# Patient Record
Sex: Male | Born: 1980 | Race: White | Hispanic: Yes | Marital: Married | Smoking: Former smoker
Health system: Southern US, Community
[De-identification: ages and names within clinical notes are randomized; demographics above are authoritative.]

## PROBLEM LIST (undated history)

## (undated) DIAGNOSIS — R569 Unspecified convulsions: Secondary | ICD-10-CM

## (undated) DIAGNOSIS — K219 Gastro-esophageal reflux disease without esophagitis: Secondary | ICD-10-CM

## (undated) DIAGNOSIS — K760 Fatty (change of) liver, not elsewhere classified: Secondary | ICD-10-CM

## (undated) HISTORY — PX: NO PAST SURGERIES: SHX2092

---

## 2004-06-08 ENCOUNTER — Emergency Department: Payer: Self-pay | Admitting: Emergency Medicine

## 2004-07-11 ENCOUNTER — Ambulatory Visit: Payer: Self-pay | Admitting: Psychiatry

## 2004-07-19 ENCOUNTER — Ambulatory Visit: Payer: Self-pay | Admitting: Psychiatry

## 2005-01-11 ENCOUNTER — Emergency Department: Payer: Self-pay | Admitting: General Practice

## 2005-09-21 ENCOUNTER — Emergency Department: Payer: Self-pay | Admitting: Emergency Medicine

## 2005-09-24 ENCOUNTER — Emergency Department: Payer: Self-pay | Admitting: Emergency Medicine

## 2005-10-20 ENCOUNTER — Emergency Department: Payer: Self-pay | Admitting: Emergency Medicine

## 2016-06-21 ENCOUNTER — Emergency Department
Admission: EM | Admit: 2016-06-21 | Discharge: 2016-06-21 | Disposition: A | Discharge status: Home / Self Care | Payer: BLUE CROSS/BLUE SHIELD | Attending: Emergency Medicine | Admitting: Emergency Medicine

## 2016-06-21 ENCOUNTER — Emergency Department: Payer: BLUE CROSS/BLUE SHIELD

## 2016-06-21 DIAGNOSIS — R05 Cough: Secondary | ICD-10-CM | POA: Diagnosis present

## 2016-06-21 DIAGNOSIS — B9789 Other viral agents as the cause of diseases classified elsewhere: Secondary | ICD-10-CM

## 2016-06-21 DIAGNOSIS — J069 Acute upper respiratory infection, unspecified: Secondary | ICD-10-CM | POA: Diagnosis not present

## 2016-06-21 MED ORDER — GUAIFENESIN-CODEINE 100-10 MG/5ML PO SYRP: 10.0000 mL | ORAL_SOLUTION | Freq: Three times a day (TID) | ORAL | 0 refills | Status: DC | PRN

## 2016-06-21 NOTE — ED Triage Notes (Signed)
Pt with cough since Sunday, reports green sputum. Pt with even and nonlabored respirations.

## 2016-06-21 NOTE — ED Provider Notes (Signed)
[facility] Emergency Department Provider Note  ____________________________________________  Time seen: Approximately 11:23 AM  I have reviewed the triage vital signs and the nursing notes.   HISTORY  Chief Complaint Cough   HPI Juan Padilla is a 35 y.o. male who presents to the emergency department for evaluation of cough since Sunday. Cough is productive of green sputum.No relief with ibuprofen and OTC cold medications.   No past medical history on file.  There are no active problems to display for this patient.   No past surgical history on file.  Prior to Admission medications   Not on File    Allergies Review of patient's allergies indicates no known allergies.  No family history on file.  Social History Social History  Substance Use Topics  . Smoking status: Not on file  . Smokeless tobacco: Not on file  . Alcohol use Not on file    Review of Systems Constitutional: No fever/chills ENT: No sore throat. Cardiovascular: Denies chest pain. Respiratory: No shortness of breath. Positive for cough. Gastrointestinal: Negative for nausea,  no vomiting.  No diarrhea.  Musculoskeletal: Positive for body aches Skin: Negative for rash. Neurological: Positie for headaches ____________________________________________   PHYSICAL EXAM:   VITAL SIGNS: Blood pressure 133/93, heart rate 95, respiratory rate 18, SPO2 96% on room air, temperature 100.4 ED Triage Vitals [06/21/16 1110]  Enc Vitals Group     BP      Pulse      Resp      Temp      Temp src      SpO2      Weight (!) 306 lb (138.8 kg)     Height 5\' 8"  (1.727 m)     Head Circumference      Peak Flow      Pain Score      Pain Loc      Pain Edu?      Excl. in GC?     Constitutional: Alert and oriented. Well appearing and in no acute distress. Eyes: Conjunctivae are normal. EOMI. Ears: Bilateral TM normal Nose: No congestion; no rhinnorhea. Mouth/Throat: Mucous  membranes are moist.  Oropharynx clear. Tonsils appear 1+ without exudate. Neck: No stridor.  Lymphatic: No cervical lymphadenopathy. Cardiovascular: Normal rate, regular rhythm. Grossly normal heart sounds.  Good peripheral circulation. Respiratory: Normal respiratory effort.  No retractions. Breath sounds clear throughout. Gastrointestinal: Soft and nontender.  Musculoskeletal: FROM x 4 extremities.  Neurologic:  Normal speech and language.  Skin:  Skin is warm, dry and intact. No rash noted. Psychiatric: Mood and affect are normal. Speech and behavior are normal.  ____________________________________________   LABS (all labs ordered are listed, but only abnormal results are displayed)  Labs Reviewed - No data to display ____________________________________________  EKG   ____________________________________________  RADIOLOGY  Chest x-ray negative for acute cardiopulmonary abnormality per radiology. ____________________________________________   PROCEDURES  Procedure(s) performed: None  Critical Care performed: No  ____________________________________________   INITIAL IMPRESSION / ASSESSMENT AND PLAN / ED COURSE  Clinical Course    Pertinent labs & imaging results that were available during my care of the patient were reviewed by me and considered in my medical decision making (see chart for details).   Patient was prescribed Robitussin-AC and advised to take Tylenol or ibuprofen in addition for pain or fever. He was instructed to follow-up with the primary care provider of his choice for symptoms that are not improving over the next few days. He was  instructed to return to the emergency department for symptoms that change or worsen if unable to schedule appointment. ____________________________________________   FINAL CLINICAL IMPRESSION(S) / ED DIAGNOSES  Final diagnoses:  None    Note:  This document was prepared using Dragon voice recognition software  and may include unintentional dictation errors.     Chinita Pester, FNP 06/21/16 1432    Sharman Cheek, MD 06/21/16 1515

## 2016-06-21 NOTE — ED Notes (Signed)
States productive cough for several days, greenish sputum, pt awake and alert in no resp distress

## 2017-01-05 ENCOUNTER — Encounter: Payer: Self-pay | Admitting: Emergency Medicine

## 2017-01-05 ENCOUNTER — Emergency Department
Admission: EM | Admit: 2017-01-05 | Discharge: 2017-01-05 | Disposition: A | Discharge status: Home / Self Care | Payer: BLUE CROSS/BLUE SHIELD | Attending: Emergency Medicine | Admitting: Emergency Medicine

## 2017-01-05 ENCOUNTER — Emergency Department: Payer: BLUE CROSS/BLUE SHIELD

## 2017-01-05 DIAGNOSIS — S79912A Unspecified injury of left hip, initial encounter: Secondary | ICD-10-CM | POA: Diagnosis present

## 2017-01-05 DIAGNOSIS — Z87891 Personal history of nicotine dependence: Secondary | ICD-10-CM | POA: Diagnosis not present

## 2017-01-05 DIAGNOSIS — S301XXA Contusion of abdominal wall, initial encounter: Secondary | ICD-10-CM | POA: Insufficient documentation

## 2017-01-05 DIAGNOSIS — W1789XA Other fall from one level to another, initial encounter: Secondary | ICD-10-CM | POA: Diagnosis not present

## 2017-01-05 DIAGNOSIS — Y93I9 Activity, other involving external motion: Secondary | ICD-10-CM | POA: Diagnosis not present

## 2017-01-05 DIAGNOSIS — Y929 Unspecified place or not applicable: Secondary | ICD-10-CM | POA: Diagnosis not present

## 2017-01-05 DIAGNOSIS — S7002XA Contusion of left hip, initial encounter: Secondary | ICD-10-CM | POA: Diagnosis not present

## 2017-01-05 DIAGNOSIS — Y998 Other external cause status: Secondary | ICD-10-CM | POA: Diagnosis not present

## 2017-01-05 DIAGNOSIS — R109 Unspecified abdominal pain: Secondary | ICD-10-CM

## 2017-01-05 LAB — COMPREHENSIVE METABOLIC PANEL
ALT: 140 U/L — AB (ref 17–63)
ANION GAP: 5 (ref 5–15)
AST: 72 U/L — ABNORMAL HIGH (ref 15–41)
Albumin: 4.4 g/dL (ref 3.5–5.0)
Alkaline Phosphatase: 57 U/L (ref 38–126)
BUN: 9 mg/dL (ref 6–20)
CHLORIDE: 105 mmol/L (ref 101–111)
CO2: 26 mmol/L (ref 22–32)
Calcium: 9.2 mg/dL (ref 8.9–10.3)
Creatinine, Ser: 1.09 mg/dL (ref 0.61–1.24)
GFR calc non Af Amer: >60 mL/min (ref >60)
Glucose, Bld: 107 mg/dL — ABNORMAL HIGH (ref 65–99)
Potassium: 4.1 mmol/L (ref 3.5–5.1)
SODIUM: 136 mmol/L (ref 135–145)
Total Bilirubin: 1.3 mg/dL — ABNORMAL HIGH (ref 0.3–1.2)
Total Protein: 8 g/dL (ref 6.5–8.1)

## 2017-01-05 LAB — CBC WITH DIFFERENTIAL/PLATELET
BASOS ABS: 0.1 10*3/uL (ref 0–0.1)
BASOS PCT: 1 %
EOS ABS: 0.3 10*3/uL (ref 0–0.7)
Eosinophils Relative: 4 %
HCT: 41 % (ref 40.0–52.0)
Hemoglobin: 14.5 g/dL (ref 13.0–18.0)
Lymphocytes Relative: 36 %
Lymphs Abs: 2.6 10*3/uL (ref 1.0–3.6)
MCH: 31.4 pg (ref 26.0–34.0)
MCHC: 35.3 g/dL (ref 32.0–36.0)
MCV: 89 fL (ref 80.0–100.0)
Monocytes Absolute: 0.6 10*3/uL (ref 0.2–1.0)
Monocytes Relative: 8 %
NEUTROS PCT: 51 %
Neutro Abs: 3.7 10*3/uL (ref 1.4–6.5)
PLATELETS: 252 10*3/uL (ref 150–440)
RBC: 4.61 MIL/uL (ref 4.40–5.90)
RDW: 13.1 % (ref 11.5–14.5)
WBC: 7.3 10*3/uL (ref 3.8–10.6)

## 2017-01-05 LAB — URINALYSIS, COMPLETE (UACMP) WITH MICROSCOPIC
BACTERIA UA: NONE SEEN
BILIRUBIN URINE: NEGATIVE
Glucose, UA: NEGATIVE mg/dL
Hgb urine dipstick: NEGATIVE
KETONES UR: NEGATIVE mg/dL
LEUKOCYTES UA: NEGATIVE
NITRITE: NEGATIVE
Protein, ur: NEGATIVE mg/dL
RBC / HPF: NONE SEEN RBC/hpf (ref 0–5)
Specific Gravity, Urine: 1.006 (ref 1.005–1.030)
Squamous Epithelial / LPF: NONE SEEN
WBC UA: NONE SEEN WBC/hpf (ref 0–5)
pH: 8 (ref 5.0–8.0)

## 2017-01-05 MED ORDER — IOPAMIDOL (ISOVUE-300) INJECTION 61%
125.0000 mL | Freq: Once | INTRAVENOUS | Status: AC | PRN
  Administered 2017-01-05: 125 mL via INTRAVENOUS
  Filled 2017-01-05: qty 150

## 2017-01-05 MED ORDER — SODIUM CHLORIDE 0.9 % IV BOLUS (SEPSIS)
1000.0000 mL | Freq: Once | INTRAVENOUS | Status: AC
  Administered 2017-01-05: 1000 mL via INTRAVENOUS

## 2017-01-05 MED ORDER — NAPROXEN 500 MG PO TABS: 500.0000 mg | ORAL_TABLET | Freq: Two times a day (BID) | ORAL | 0 refills | Status: DC

## 2017-01-05 NOTE — ED Notes (Signed)
Patient transported to CT 

## 2017-01-05 NOTE — ED Provider Notes (Signed)
[facility] Emergency Department Provider Note  ____________________________________________  Time seen: Approximately 4:23 PM  I have reviewed the triage vital signs and the nursing notes.   HISTORY  Chief Complaint Fall    HPI Juan Padilla is a 36 y.o. male who reports a fall 1 week ago. He was standing up to get off of a riding lawnmower that he was trying to repair, when it inadvertently switched into its forward gear and started driving. He lost his balance and fell off onto his left side. He had some pain initially, but no bruising. Now over the last 4 or 5 days, he's had worsening bruising at the left hip and left flank. He also reports that when he stands up on a ladder his left hip hurts, and when he applies pressure to the area it is painful. Denies any dizziness chest pain shortness of breath vomiting or diarrhea. No bloody stool. No hematuria. Pain is intermittent, moderate in severity, sharp. No alleviating factors.     History reviewed. No pertinent past medical history.   There are no active problems to display for this patient.    History reviewed. No pertinent surgical history.   Prior to Admission medications   Medication Sig Start Date End Date Taking? Authorizing Provider  guaiFENesin-codeine (ROBITUSSIN AC) 100-10 MG/5ML syrup Take 10 mLs by mouth 3 (three) times daily as needed for cough. 06/21/16   Triplett, Rulon Eisenmenger B, FNP  naproxen (NAPROSYN) 500 MG tablet Take 1 tablet (500 mg total) by mouth 2 (two) times daily with a meal. 01/05/17   Sharman Cheek, MD     Allergies Patient has no known allergies.   History reviewed. No pertinent family history.  Social History Social History  Substance Use Topics  . Smoking status: Former Games developer  . Smokeless tobacco: Never Used  . Alcohol use Yes    Review of Systems  Constitutional:   No fever or chills.  ENT:   No sore throat. No rhinorrhea. Lymphatic: No swollen glands, No  extremity swelling Endocrine: No hot/cold flashes. No significant weight change. No neck swelling. Cardiovascular:   No chest pain or syncope. Respiratory:   No dyspnea or cough. Gastrointestinal:   Positive as above for left sided abdominal and flank pain, without vomiting and diarrhea.  Genitourinary:   Negative for dysuria or difficulty urinating. Musculoskeletal:   Left hip pain as above  Neurological:   Negative for headaches or weakness. All other systems reviewed and are negative except as documented above in ROS and HPI.  ____________________________________________   PHYSICAL EXAM:  VITAL SIGNS: ED Triage Vitals  Enc Vitals Group     BP 01/05/17 1204 (!) 149/88     Pulse Rate 01/05/17 1203 90     Resp 01/05/17 1203 20     Temp 01/05/17 1203 98.6 F (37 C)     Temp Source 01/05/17 1203 Oral     SpO2 01/05/17 1203 97 %     Weight 01/05/17 1203 (!) 309 lb (140.2 kg)     Height 01/05/17 1203 5\' 8"  (1.727 m)     Head Circumference --      Peak Flow --      Pain Score 01/05/17 1203 6     Pain Loc --      Pain Edu? --      Excl. in GC? --     Vital signs reviewed, nursing assessments reviewed.   Constitutional:   Alert and oriented. Well appearing and in  no distress. Eyes:   No scleral icterus. No conjunctival pallor. PERRL. EOMI.  No nystagmus. ENT   Head:   Normocephalic and atraumatic.   Nose:   No congestion/rhinnorhea. No septal hematoma   Mouth/Throat:   MMM, no pharyngeal erythema. No peritonsillar mass.    Neck:   No stridor. No SubQ emphysema. No meningismus. Hematological/Lymphatic/Immunilogical:   No cervical lymphadenopathy. Cardiovascular:   RRR. Symmetric bilateral radial and DP pulses.  No murmurs.  Respiratory:   Normal respiratory effort without tachypnea nor retractions. Breath sounds are clear and equal bilaterally. No wheezes/rales/rhonchi.Chest wall stable and nontender Gastrointestinal:   Soft with left sided tenderness and left  flank tenderness. There is extensive linear bruising in the area of the left iliac crest laterally.. Non distended. There is no CVA tenderness.  No rebound, rigidity, or guarding. Genitourinary:   deferred Musculoskeletal:   Normal range of motion in all extremities. No joint effusions.  No lower extremity tenderness.  No edema. Left iliac crest tender to the touch without mobility. Bruising along the left flank from the left lower back to the anterior left lower quadrant of the abdomen, central around the iliac crest. Neurologic:   Normal speech and language.  CN 2-10 normal. Motor grossly intact. No gross focal neurologic deficits are appreciated.  Skin:    Skin is warm, dry and intact. Ecchymosis as above  ____________________________________________    LABS (pertinent positives/negatives) (all labs ordered are listed, but only abnormal results are displayed) Labs Reviewed  COMPREHENSIVE METABOLIC PANEL - Abnormal; Notable for the following:       Result Value   Glucose, Bld 107 (*)    AST 72 (*)    ALT 140 (*)    Total Bilirubin 1.3 (*)    All other components within normal limits  URINALYSIS, COMPLETE (UACMP) WITH MICROSCOPIC - Abnormal; Notable for the following:    Color, Urine STRAW (*)    APPearance CLEAR (*)    All other components within normal limits  CBC WITH DIFFERENTIAL/PLATELET   ____________________________________________   EKG    ____________________________________________    RADIOLOGY  Ct Abdomen Pelvis W Contrast  Result Date: 01/05/2017 CLINICAL DATA:  The fell last week. Persistent left-sided pain and bruising. EXAM: CT ABDOMEN AND PELVIS WITH CONTRAST TECHNIQUE: Multidetector CT imaging of the abdomen and pelvis was performed using the standard protocol following bolus administration of intravenous contrast. CONTRAST:  ISOVUE-300 IOPAMIDOL (ISOVUE-300) INJECTION 61% COMPARISON:  None. FINDINGS: Lower chest: The lung bases are clear of acute  process. No pleural effusion or pulmonary lesions. The heart is normal in size. No pericardial effusion. The distal esophagus and aorta are unremarkable. Hepatobiliary: Severe and diffuse fatty infiltration of the liver. No focal hepatic lesions or intrahepatic biliary dilatation. Areas of fatty sparing are noted. The portal and hepatic veins are patent. The gallbladder appears normal. No common bile duct dilatation. Pancreas: No mass, inflammation or ductal dilatation. Spleen: Normal size.  No acute injury. Adrenals/Urinary Tract: Adrenal glands kidneys are normal. No renal or ureteral calculi. No bladder calculi are mass. Stomach/Bowel: The stomach, duodenum, small bowel and colon are unremarkable. No acute inflammatory changes, mass lesions or obstructive findings. Vascular/Lymphatic: The aorta is normal in caliber. No dissection. The branch vessels are patent. The major venous structures are patent. No mesenteric or retroperitoneal mass or adenopathy. Small scattered lymph nodes are noted. Reproductive: The prostate gland and seminal vesicles are normal. Other: No pelvic mass or adenopathy. No free pelvic fluid collections. No inguinal  mass or adenopathy. No abdominal wall hernia or subcutaneous lesions. There is edema, fluid or hemorrhage noted in the left lower flank area and upper left pelvic area most notably at the level of the iliac crests. This is likely contusion and hematoma. Musculoskeletal: The bony structures are intact. No pelvic or hip fracture. The pubic symphysis and SI joints are intact. No transverse process fractures are identified. No left-sided rib fractures. 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 The IMPRESSION: 1. No acute intra-abdominal/intrapelvic abnormalities. 2. Marked and diffuse fatty infiltration of the liver. 3. The bony structures are intact.  No acute fractures. 4. Contusion/hematoma in the subcutaneous fat overlying the left lower flank area and left  upper pelvic area centered at the iliac crest. No underlying pelvic fracture. 5. 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 Electronically Signed   By: Rudie MeyerP.  Gallerani M.D.   On: 01/05/2017 15:42    ____________________________________________   PROCEDURES Procedures  ____________________________________________   INITIAL IMPRESSION / ASSESSMENT AND PLAN / ED COURSE  Pertinent labs & imaging results that were available during my care of the patient were reviewed by me and considered in my medical decision making (see chart for details).    Clinical Course as of Jan 05 1622  Fri Jan 05, 2017  1500 Concern for splenic vs liver vs pelvic injury. Check CT a/p.  [PS]  1619 CT c/w hip pointer. Will tx sympotomaticalyl.  [PS]    Clinical Course User Index [PS] Sharman CheekStafford, Giani Betzold, MD     ----------------------------------------- 4:27 PM on 01/05/2017 -----------------------------------------  She remains well appearing, normal vital signs per workup negative except for evidence of the clinically apparent hip pointer injury. No solid organ injury. No other traumatic injuries suspected based on symptoms and exam which are otherwise reassuring. We'll discharge home to follow up with primary care. NSAIDs for pain.Considering the patient's symptoms, medical history, and physical examination today, I have low suspicion for cholecystitis or biliary pathology, pancreatitis, perforation or bowel obstruction, hernia, intra-abdominal abscess, AAA or dissection, volvulus or intussusception, mesenteric ischemia, or appendicitis.    ____________________________________________   FINAL CLINICAL IMPRESSION(S) / ED DIAGNOSES  Final diagnoses:  Hip pointer, initial encounter  Left flank pain      New Prescriptions   NAPROXEN (NAPROSYN) 500 MG TABLET    Take 1 tablet (500 mg total) by mouth 2 (two) times daily with a meal.     Portions of this note were generated with  dragon dictation software. Dictation errors may occur despite best attempts at proofreading.    Sharman CheekStafford, Jaiyana Canale, MD 01/05/17 [phone]

## 2017-01-05 NOTE — ED Notes (Signed)
MD at bedside. 

## 2017-01-05 NOTE — ED Triage Notes (Signed)
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  Reports felt ok through beginning of week and today noticed significant bruising and pain to abdomen. Pt has large bruise to LLQ and left side/flank.  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  Denies hematuria. Ambulatory to triage. Denies hitting head.

## 2017-01-05 NOTE — Discharge Instructions (Signed)
Your CT scan does not find any significant intra-abdominal or pelvis injuries. It does show a lot of bruising around the left hip in the soft tissues, consistent with a hip pointer injury.

## 2018-03-29 ENCOUNTER — Other Ambulatory Visit: Payer: Self-pay | Admitting: Orthopedic Surgery

## 2018-03-29 DIAGNOSIS — Z1389 Encounter for screening for other disorder: Secondary | ICD-10-CM

## 2018-03-29 DIAGNOSIS — Z0189 Encounter for other specified special examinations: Secondary | ICD-10-CM

## 2018-05-15 ENCOUNTER — Other Ambulatory Visit: Payer: Self-pay | Admitting: Orthopedic Surgery

## 2018-05-16 ENCOUNTER — Other Ambulatory Visit: Payer: Self-pay

## 2018-05-16 ENCOUNTER — Encounter
Admission: RE | Admit: 2018-05-16 | Discharge: 2018-05-16 | Disposition: A | Discharge status: Home / Self Care | Payer: BLUE CROSS/BLUE SHIELD | Source: Ambulatory Visit | Attending: Orthopedic Surgery | Admitting: Orthopedic Surgery

## 2018-05-16 HISTORY — DX: Gastro-esophageal reflux disease without esophagitis: K21.9

## 2018-05-16 HISTORY — DX: Fatty (change of) liver, not elsewhere classified: K76.0

## 2018-05-16 NOTE — Patient Instructions (Signed)
Your procedure is scheduled on: 05-23-18 Report to Same Day Surgery 2nd floor medical mall [facility](Medical Mall Entrance-take elevator on left to 2nd floor.  Check in with surgery information desk.) To find out your arrival time please call [phone] between 1PM - 3PM on 05-22-18  Remember: Instructions that are not followed completely may result in serious medical risk, up to and including death, or upon the discretion of your surgeon and anesthesiologist your surgery may need to be rescheduled.    _x___ 1. Do not eat food after midnight the night before your procedure. NO GUM OR CANDY AFTER MIDNIGHT.  You may drink clear liquids up to 2 hours before you are scheduled to arrive at the hospital for your procedure.  Do not drink clear liquids within 2 hours of your scheduled arrival to the hospital.  Clear liquids include  --Water or Apple juice without pulp  --Clear carbohydrate beverage such as ClearFast or Gatorade  --Black Coffee or Clear Tea (No milk, no creamers, do not add anything to the coffee or Tea   ____Ensure clear carbohydrate drink on the way to the hospital for bariatric patients  ____Ensure clear carbohydrate drink 3 hours before surgery for Dr Rutherford NailByrnett's patients if physician instructed.     __x__ 2. No Alcohol for 24 hours before or after surgery.   __x__3. No Smoking or e-cigarettes for 24 prior to surgery.  Do not use any chewable tobacco products for at least 6 hour prior to surgery   ____  4. Bring all medications with you on the day of surgery if instructed.    __x__ 5. Notify your doctor if there is any change in your medical condition     (cold, fever, infections).    x___6. On the morning of surgery brush your teeth with toothpaste and water.  You may rinse your mouth with mouth wash if you wish.  Do not swallow any toothpaste or mouthwash.   Do not wear jewelry, make-up, hairpins, clips or nail polish.  Do not wear lotions, powders, or perfumes. You may wear  deodorant.  Do not shave 48 hours prior to surgery. Men may shave face and neck.  Do not bring valuables to the hospital.    [facility] for any belongings or valuables.               Contacts, dentures or bridgework may not be worn into surgery.  Leave your suitcase in the car. After surgery it may be brought to your room.  For patients admitted to the hospital, discharge time is determined by your treatment team.  _  Patients discharged the day of surgery will not be allowed to drive home.  You will need someone to drive you home and stay with you the night of your procedure.    Please read over the following fact sheets that you were given:   [facility] Preparing for Surgery   _x___ TAKE THE FOLLOWING MEDICATION THE MORNING OF SURGERY WITH A SMALL SIP OF WATER. These include:  1. ZANTAC  2. TAKE A ZANTAC THE NIGHT BEFORE YOUR SURGERY  3.  4.  5.  6.  ____Fleets enema or Magnesium Citrate as directed.   _x___ Use CHG Soap or sage wipes as directed on instruction sheet   ____ Use inhalers on the day of surgery and bring to hospital day of surgery  ____ Stop Metformin and Janumet 2 days prior to surgery.    ____ Take 1/2 of  usual insulin dose the night before surgery and none on the morning surgery.   ____ Follow recommendations from Cardiologist, Pulmonologist or PCP regarding stopping Aspirin, Coumadin, Plavix ,Eliquis, Effient, or Pradaxa, and Pletal.  X____Stop Anti-inflammatories such as Advil, Aleve, Ibuprofen, Motrin, Naproxen, Naprosyn, Goodies powders or aspirin products NOW- OK to take Tylenol    ____ Stop supplements until after surgery.     ____ Bring C-Pap to the hospital.

## 2018-05-20 ENCOUNTER — Encounter
Admission: RE | Admit: 2018-05-20 | Discharge: 2018-05-20 | Disposition: A | Discharge status: Home / Self Care | Payer: BLUE CROSS/BLUE SHIELD | Source: Ambulatory Visit | Attending: Orthopedic Surgery | Admitting: Orthopedic Surgery

## 2018-05-20 DIAGNOSIS — Z01812 Encounter for preprocedural laboratory examination: Secondary | ICD-10-CM | POA: Insufficient documentation

## 2018-05-20 LAB — COMPREHENSIVE METABOLIC PANEL
ALK PHOS: 66 U/L (ref 38–126)
ALT: 120 U/L — AB (ref 0–44)
AST: 64 U/L — ABNORMAL HIGH (ref 15–41)
Albumin: 4.4 g/dL (ref 3.5–5.0)
Anion gap: 7 (ref 5–15)
BILIRUBIN TOTAL: 0.8 mg/dL (ref 0.3–1.2)
BUN: 11 mg/dL (ref 6–20)
CALCIUM: 9.2 mg/dL (ref 8.9–10.3)
CO2: 26 mmol/L (ref 22–32)
CREATININE: 1.01 mg/dL (ref 0.61–1.24)
Chloride: 107 mmol/L (ref 98–111)
GFR calc Af Amer: >60 mL/min (ref >60)
GFR calc non Af Amer: >60 mL/min (ref >60)
GLUCOSE: 193 mg/dL — AB (ref 70–99)
Potassium: 3.5 mmol/L (ref 3.5–5.1)
Sodium: 140 mmol/L (ref 135–145)
TOTAL PROTEIN: 7.7 g/dL (ref 6.5–8.1)

## 2018-05-20 LAB — CBC WITH DIFFERENTIAL/PLATELET
Basophils Absolute: 0.1 10*3/uL (ref 0–0.1)
Basophils Relative: 2 %
EOS ABS: 0.2 10*3/uL (ref 0–0.7)
EOS PCT: 4 %
HCT: 43.1 % (ref 40.0–52.0)
HEMOGLOBIN: 15.1 g/dL (ref 13.0–18.0)
LYMPHS ABS: 2.2 10*3/uL (ref 1.0–3.6)
Lymphocytes Relative: 46 %
MCH: 31.9 pg (ref 26.0–34.0)
MCHC: 35 g/dL (ref 32.0–36.0)
MCV: 91.1 fL (ref 80.0–100.0)
MONO ABS: 0.3 10*3/uL (ref 0.2–1.0)
Monocytes Relative: 7 %
Neutro Abs: 2 10*3/uL (ref 1.4–6.5)
Neutrophils Relative %: 41 %
PLATELETS: 217 10*3/uL (ref 150–440)
RBC: 4.73 MIL/uL (ref 4.40–5.90)
RDW: 13 % (ref 11.5–14.5)
WBC: 4.9 10*3/uL (ref 3.8–10.6)

## 2018-05-20 LAB — PROTIME-INR
INR: 0.92
PROTHROMBIN TIME: 12.3 s (ref 11.4–15.2)

## 2018-05-20 LAB — APTT: aPTT: 29 seconds (ref 24–36)

## 2018-05-22 MED ORDER — CEFAZOLIN SODIUM-DEXTROSE 2-4 GM/100ML-% IV SOLN
2.0000 g | INTRAVENOUS | Status: AC
  Administered 2018-05-23: 2 g via INTRAVENOUS

## 2018-05-23 ENCOUNTER — Ambulatory Visit
Admission: RE | Admit: 2018-05-23 | Discharge: 2018-05-23 | Disposition: A | Discharge status: Home / Self Care | Payer: Worker's Compensation | Source: Ambulatory Visit | Attending: Orthopedic Surgery | Admitting: Orthopedic Surgery

## 2018-05-23 ENCOUNTER — Encounter
Admission: RE | Disposition: A | Discharge status: Home / Self Care | Payer: Self-pay | Source: Ambulatory Visit | Attending: Orthopedic Surgery

## 2018-05-23 ENCOUNTER — Ambulatory Visit: Payer: Worker's Compensation | Admitting: Certified Registered"

## 2018-05-23 DIAGNOSIS — S43431A Superior glenoid labrum lesion of right shoulder, initial encounter: Secondary | ICD-10-CM | POA: Insufficient documentation

## 2018-05-23 DIAGNOSIS — K760 Fatty (change of) liver, not elsewhere classified: Secondary | ICD-10-CM | POA: Diagnosis not present

## 2018-05-23 DIAGNOSIS — Z79899 Other long term (current) drug therapy: Secondary | ICD-10-CM | POA: Diagnosis not present

## 2018-05-23 DIAGNOSIS — K219 Gastro-esophageal reflux disease without esophagitis: Secondary | ICD-10-CM | POA: Diagnosis not present

## 2018-05-23 DIAGNOSIS — X58XXXA Exposure to other specified factors, initial encounter: Secondary | ICD-10-CM | POA: Insufficient documentation

## 2018-05-23 DIAGNOSIS — Z87891 Personal history of nicotine dependence: Secondary | ICD-10-CM | POA: Diagnosis not present

## 2018-05-23 DIAGNOSIS — M7541 Impingement syndrome of right shoulder: Secondary | ICD-10-CM | POA: Insufficient documentation

## 2018-05-23 DIAGNOSIS — M7551 Bursitis of right shoulder: Secondary | ICD-10-CM | POA: Insufficient documentation

## 2018-05-23 DIAGNOSIS — Y939 Activity, unspecified: Secondary | ICD-10-CM | POA: Diagnosis not present

## 2018-05-23 DIAGNOSIS — R569 Unspecified convulsions: Secondary | ICD-10-CM | POA: Diagnosis not present

## 2018-05-23 HISTORY — PX: SHOULDER ARTHROSCOPY WITH LABRAL REPAIR: SHX5691

## 2018-05-23 HISTORY — DX: Unspecified convulsions: R56.9

## 2018-05-23 LAB — GLUCOSE, CAPILLARY
GLUCOSE-CAPILLARY: 129 mg/dL — AB (ref 70–99)
Glucose-Capillary: 120 mg/dL — ABNORMAL HIGH (ref 70–99)

## 2018-05-23 SURGERY — ARTHROSCOPY, SHOULDER, WITH GLENOID LABRUM REPAIR
Anesthesia: General | Site: Shoulder | Laterality: Right

## 2018-05-23 MED ORDER — SUGAMMADEX SODIUM 500 MG/5ML IV SOLN
INTRAVENOUS | Status: AC
  Filled 2018-05-23: qty 5

## 2018-05-23 MED ORDER — CEFAZOLIN SODIUM-DEXTROSE 1-4 GM/50ML-% IV SOLN
INTRAVENOUS | Status: DC | PRN
  Administered 2018-05-23: 1 g via INTRAVENOUS

## 2018-05-23 MED ORDER — ONDANSETRON HCL 4 MG/2ML IJ SOLN
INTRAMUSCULAR | Status: DC | PRN
  Administered 2018-05-23: 4 mg via INTRAVENOUS

## 2018-05-23 MED ORDER — LIDOCAINE HCL 4 % MT SOLN
OROMUCOSAL | Status: DC | PRN
  Administered 2018-05-23: 4 mL via TOPICAL

## 2018-05-23 MED ORDER — LIDOCAINE HCL (CARDIAC) PF 100 MG/5ML IV SOSY
PREFILLED_SYRINGE | INTRAVENOUS | Status: DC | PRN
  Administered 2018-05-23: 100 mg via INTRAVENOUS

## 2018-05-23 MED ORDER — CHLORHEXIDINE GLUCONATE CLOTH 2 % EX PADS: 6.0000 | MEDICATED_PAD | Freq: Once | CUTANEOUS | Status: DC

## 2018-05-23 MED ORDER — FENTANYL CITRATE (PF) 100 MCG/2ML IJ SOLN
INTRAMUSCULAR | Status: AC
  Filled 2018-05-23: qty 2

## 2018-05-23 MED ORDER — SUGAMMADEX SODIUM 500 MG/5ML IV SOLN
INTRAVENOUS | Status: DC | PRN
  Administered 2018-05-23: 280 mg via INTRAVENOUS

## 2018-05-23 MED ORDER — FENTANYL CITRATE (PF) 100 MCG/2ML IJ SOLN
INTRAMUSCULAR | Status: DC | PRN
  Administered 2018-05-23: 100 ug via INTRAVENOUS

## 2018-05-23 MED ORDER — ROCURONIUM BROMIDE 100 MG/10ML IV SOLN
INTRAVENOUS | Status: DC | PRN
  Administered 2018-05-23: 50 mg via INTRAVENOUS

## 2018-05-23 MED ORDER — SODIUM CHLORIDE 0.9 % IV SOLN
INTRAVENOUS | Status: DC | PRN
  Administered 2018-05-23: 20 ug/min via INTRAVENOUS

## 2018-05-23 MED ORDER — FENTANYL CITRATE (PF) 100 MCG/2ML IJ SOLN
50.0000 ug | Freq: Once | INTRAMUSCULAR | Status: AC
  Administered 2018-05-23: 50 ug via INTRAVENOUS

## 2018-05-23 MED ORDER — FENTANYL CITRATE (PF) 100 MCG/2ML IJ SOLN: 25.0000 ug | INTRAMUSCULAR | Status: DC | PRN

## 2018-05-23 MED ORDER — ONDANSETRON HCL 4 MG PO TABS: 4.0000 mg | ORAL_TABLET | Freq: Three times a day (TID) | ORAL | 0 refills | Status: AC | PRN

## 2018-05-23 MED ORDER — OXYCODONE HCL 5 MG PO TABS: 5.0000 mg | ORAL_TABLET | ORAL | 0 refills | Status: AC | PRN

## 2018-05-23 MED ORDER — LIDOCAINE HCL (PF) 1 % IJ SOLN
INTRAMUSCULAR | Status: AC
  Filled 2018-05-23: qty 5

## 2018-05-23 MED ORDER — SEVOFLURANE IN SOLN
RESPIRATORY_TRACT | Status: AC
  Filled 2018-05-23: qty 250

## 2018-05-23 MED ORDER — ONDANSETRON HCL 4 MG/2ML IJ SOLN: 4.0000 mg | Freq: Once | INTRAMUSCULAR | Status: DC | PRN

## 2018-05-23 MED ORDER — ROCURONIUM BROMIDE 50 MG/5ML IV SOLN
INTRAVENOUS | Status: AC
  Filled 2018-05-23: qty 1

## 2018-05-23 MED ORDER — LACTATED RINGERS IV SOLN
INTRAVENOUS | Status: DC | PRN
  Administered 2018-05-23: 17 mL

## 2018-05-23 MED ORDER — BUPIVACAINE LIPOSOME 1.3 % IJ SUSP
INTRAMUSCULAR | Status: DC | PRN
  Administered 2018-05-23: 20 mL

## 2018-05-23 MED ORDER — BUPIVACAINE HCL (PF) 0.25 % IJ SOLN
INTRAMUSCULAR | Status: DC | PRN
  Administered 2018-05-23: 30 mL

## 2018-05-23 MED ORDER — BUPIVACAINE HCL (PF) 0.5 % IJ SOLN
INTRAMUSCULAR | Status: AC
  Filled 2018-05-23: qty 10

## 2018-05-23 MED ORDER — BUPIVACAINE LIPOSOME 1.3 % IJ SUSP
INTRAMUSCULAR | Status: AC
  Filled 2018-05-23: qty 20

## 2018-05-23 MED ORDER — FENTANYL CITRATE (PF) 100 MCG/2ML IJ SOLN
INTRAMUSCULAR | Status: AC
  Administered 2018-05-23: 50 ug via INTRAVENOUS
  Filled 2018-05-23: qty 2

## 2018-05-23 MED ORDER — ONDANSETRON HCL 4 MG/2ML IJ SOLN
INTRAMUSCULAR | Status: AC
  Filled 2018-05-23: qty 2

## 2018-05-23 MED ORDER — MIDAZOLAM HCL 2 MG/2ML IJ SOLN
2.0000 mg | Freq: Once | INTRAMUSCULAR | Status: AC
  Administered 2018-05-23: 2 mg via INTRAVENOUS

## 2018-05-23 MED ORDER — CEFAZOLIN SODIUM-DEXTROSE 2-4 GM/100ML-% IV SOLN
INTRAVENOUS | Status: AC
  Filled 2018-05-23: qty 100

## 2018-05-23 MED ORDER — LIDOCAINE HCL (PF) 2 % IJ SOLN
INTRAMUSCULAR | Status: AC
  Filled 2018-05-23: qty 10

## 2018-05-23 MED ORDER — LACTATED RINGERS IV SOLN
INTRAVENOUS | Status: DC
  Administered 2018-05-23 (×2): via INTRAVENOUS

## 2018-05-23 MED ORDER — PROPOFOL 10 MG/ML IV BOLUS
INTRAVENOUS | Status: AC
  Filled 2018-05-23: qty 20

## 2018-05-23 MED ORDER — BUPIVACAINE HCL (PF) 0.5 % IJ SOLN
INTRAMUSCULAR | Status: DC | PRN
  Administered 2018-05-23: 10 mL

## 2018-05-23 MED ORDER — PROPOFOL 10 MG/ML IV BOLUS
INTRAVENOUS | Status: DC | PRN
  Administered 2018-05-23: 250 mg via INTRAVENOUS

## 2018-05-23 MED ORDER — MIDAZOLAM HCL 2 MG/2ML IJ SOLN
INTRAMUSCULAR | Status: AC
  Administered 2018-05-23: 2 mg via INTRAVENOUS
  Filled 2018-05-23: qty 2

## 2018-05-23 SURGICAL SUPPLY — 62 items
ADAPTER IRRIG TUBE 2 SPIKE SOL (ADAPTER) ×4 IMPLANT
ANCHOR SUT BIOC ST 3X145 (Anchor) ×6 IMPLANT
BUR RADIUS 4.0X18.5 (BURR) ×2 IMPLANT
BUR RADIUS 5.5 (BURR) ×2 IMPLANT
CANISTER SUCT LVC 12 LTR MEDI- (MISCELLANEOUS) IMPLANT
CANNULA 5.75X7 CRYSTAL CLEAR (CANNULA) ×4 IMPLANT
CANNULA PARTIAL THREAD 2X7 (CANNULA) ×4 IMPLANT
CANNULA TWIST IN 8.25X9CM (CANNULA) IMPLANT
COOLER POLAR GLACIER W/PUMP (MISCELLANEOUS) ×2 IMPLANT
CRADLE LAMINECT ARM (MISCELLANEOUS) ×2 IMPLANT
DEVICE SUCT BLK HOLE OR FLOOR (MISCELLANEOUS) IMPLANT
DRAPE IMP U-DRAPE 54X76 (DRAPES) ×6 IMPLANT
DRAPE INCISE IOBAN 66X45 STRL (DRAPES) ×2 IMPLANT
DRAPE SHEET LG 3/4 BI-LAMINATE (DRAPES) ×2 IMPLANT
DRAPE U-SHAPE 47X51 STRL (DRAPES) IMPLANT
DURAPREP 26ML APPLICATOR (WOUND CARE) ×8 IMPLANT
ELECT REM PT RETURN 9FT ADLT (ELECTROSURGICAL)
ELECTRODE REM PT RTRN 9FT ADLT (ELECTROSURGICAL) IMPLANT
GAUZE PETRO XEROFOAM 1X8 (MISCELLANEOUS) ×2 IMPLANT
GAUZE SPONGE 4X4 12PLY STRL (GAUZE/BANDAGES/DRESSINGS) ×2 IMPLANT
GLOVE BIOGEL PI IND STRL 9 (GLOVE) ×1 IMPLANT
GLOVE BIOGEL PI INDICATOR 9 (GLOVE) ×1
GLOVE SURG 9.0 ORTHO LTXF (GLOVE) ×6 IMPLANT
GOWN STRL REUS TWL 2XL XL LVL4 (GOWN DISPOSABLE) ×2 IMPLANT
GOWN STRL REUS W/ TWL LRG LVL3 (GOWN DISPOSABLE) ×1 IMPLANT
GOWN STRL REUS W/ TWL LRG LVL4 (GOWN DISPOSABLE) ×1 IMPLANT
GOWN STRL REUS W/TWL LRG LVL3 (GOWN DISPOSABLE) ×1
GOWN STRL REUS W/TWL LRG LVL4 (GOWN DISPOSABLE) ×1
IV LACTATED RINGER IRRG 3000ML (IV SOLUTION) ×17
IV LR IRRIG 3000ML ARTHROMATIC (IV SOLUTION) ×17 IMPLANT
KIT STABILIZATION SHOULDER (MISCELLANEOUS) ×2 IMPLANT
KIT SUTURETAK 3.0 INSERT PERC (KITS) ×2 IMPLANT
KIT TURNOVER KIT A (KITS) ×2 IMPLANT
LASSO 25 DEG QUICKPASS CVD LT (SUTURE) ×2 IMPLANT
LASSO 25 DEG RIGHT QUICKPASS (SUTURE) ×2 IMPLANT
MANIFOLD NEPTUNE II (INSTRUMENTS) ×2 IMPLANT
MASK FACE SPIDER DISP (MASK) ×2 IMPLANT
MAT ABSORB  FLUID 56X50 GRAY (MISCELLANEOUS) ×2
MAT ABSORB FLUID 56X50 GRAY (MISCELLANEOUS) ×2 IMPLANT
NEEDLE HYPO 22GX1.5 SAFETY (NEEDLE) ×2 IMPLANT
PACK ARTHROSCOPY SHOULDER (MISCELLANEOUS) ×2 IMPLANT
PAD WRAPON POLAR SHDR XLG (MISCELLANEOUS) ×1 IMPLANT
SET TUBE SUCT SHAVER OUTFL 24K (TUBING) ×2 IMPLANT
SET TUBE TIP INTRA-ARTICULAR (MISCELLANEOUS) ×2 IMPLANT
STRAP SAFETY 5IN WIDE (MISCELLANEOUS) ×2 IMPLANT
STRIP CLOSURE SKIN 1/2X4 (GAUZE/BANDAGES/DRESSINGS) ×2 IMPLANT
SUT ETHILON 4-0 (SUTURE) ×2
SUT ETHILON 4-0 FS2 18XMFL BLK (SUTURE) ×2
SUT LASSO 90 DEG SD STR (SUTURE) ×2 IMPLANT
SUT MNCRL 4-0 (SUTURE)
SUT MNCRL 4-0 27XMFL (SUTURE)
SUT PDS AB 0 CT1 27 (SUTURE) IMPLANT
SUT VIC AB 0 CT1 36 (SUTURE) IMPLANT
SUT VIC AB 2-0 CT2 27 (SUTURE) IMPLANT
SUTURE ETHLN 4-0 FS2 18XMF BLK (SUTURE) ×2 IMPLANT
SUTURE MAGNUM WIRE 2X48 BLK (SUTURE) IMPLANT
SUTURE MNCRL 4-0 27XMF (SUTURE) IMPLANT
TAPE MICROFOAM 4IN (TAPE) ×2 IMPLANT
TUBING ARTHRO INFLOW-ONLY STRL (TUBING) ×2 IMPLANT
TUBING CONNECTING 10 (TUBING) ×2 IMPLANT
WAND HAND CNTRL MULTIVAC 90 (MISCELLANEOUS) ×2 IMPLANT
WRAPON POLAR PAD SHDR XLG (MISCELLANEOUS) ×2

## 2018-05-23 NOTE — Transfer of Care (Signed)
Immediate Anesthesia Transfer of Care Note  Patient: Juan Padilla  Procedure(s) Performed: SHOULDER ARTHROSCOPY WITH LABRAL REPAIR,SUBACROMINAL DECOMPRESSION (Right Shoulder)  Patient Location: PACU  Anesthesia Type:General  Level of Consciousness: drowsy and responds to stimulation  Airway & Oxygen Therapy: Patient Spontanous Breathing and Patient connected to face mask oxygen  Post-op Assessment: Report given to RN and Post -op Vital signs reviewed and stable  Post vital signs: Reviewed and stable  Last Vitals:  Vitals Value Taken Time  BP 120/76 05/23/2018  4:37 PM  Temp    Pulse 86 05/23/2018  4:37 PM  Resp 18 05/23/2018  4:37 PM  SpO2 100 % 05/23/2018  4:37 PM  Vitals shown include unvalidated device data.  Last Pain:  Vitals:   05/23/18 0920  TempSrc: Temporal  PainSc: 5          Complications: No apparent anesthesia complications

## 2018-05-23 NOTE — Op Note (Signed)
05/23/2018  5:21 PM  PATIENT:  Juan Padilla  37 y.o. male  PRE-OPERATIVE DIAGNOSIS:  Superior glenoid labrum tear and subacromial impringement with bursitis, right shoulder  POST-OPERATIVE DIAGNOSIS:  same  PROCEDURE:  Procedure(s): RIGHT SHOULDER ARTHROSCOPIC SUPERIOR LABRAL (SLAP) REPAIR AND SUBACROMINAL DECOMPRESSION   SURGEON:  Surgeon(s) and Role:    Thornton Park, MD - Primary  ANESTHESIA:   local, general and paracervical block   PREOPERATIVE INDICATIONS:  Juan Padilla is a  37 y.o. male with a diagnosis of right shoulder superior glenoid labrum tear and subacromial impringement with bursitis who failed conservative measures and elected for surgical management.    I discussed the risks and benefits of surgery. The risks include but are not limited to infection, bleeding, nerve or blood vessel injury, joint stiffness or loss of motion, persistent pain, weakness or instability, and hardware failure and the need for further surgery. Medical risks include but are not limited to DVT and pulmonary embolism, myocardial infarction, stroke, pneumonia, respiratory failure and death. Patient understood these risks and wished to proceed.   OPERATIVE IMPLANTS: Arthrex bio suturetak anchors  3   OPERATIVE FINDINGS:  Right shoulder superior labral tear with instability of the biceps anchor, subacromial spurring with impingement.  OPERATIVE PROCEDURE:  I met with the patient in the preoperative area.  Preop history and physical was performed at the bedside.  I signed the right shoulder according the hospital's correct site of surgery protocol.   An interscalene block with Exparel was performed in the preop area by the anesthesia service.  Patient was brought to the operating room.  He underwent general endotracheal intubation. He was then positioned in a beachchair position. All bony prominences were adequately padded including the lower extremities.  Examination under anesthesia  revealed full passive range of motion and no instability with load and shift testing.  The patient was then prepped and draped in a sterile fashion. The patient received grams of Ancef prior to the onset of the case.  A timeout was performed to verify the patient's name, date of birth, medical record number, correct site of surgery and correct procedure to be performed.The timeout was also used to verify the patient received antibiotics that all appropriate instruments, implants and radiographic studies were available in the room. Once all in attendance were in agreement case began.  Bony landmarks were drawn out with a surgical marker along with proposed incisions. These were pre-injected with 1% lidocaine plain. An 11 blade was used to establish a posterior portal through which the arthroscope was placed in the glenohumeral joint. An anterior portal was established under direct visualization using an 18-gauge spinal needle for localization. A 5.75 mm arthroscopic cannula was inserted through the anterior portal. A full diagnostic examination of the glenohumeral joint was performed.  There was fraying of the articular side of the supraspinatus just behind the biceps which was carefully debrided with a 4.0 mm resector shaver blade.  There is no intrasubstance tear of the biceps tendon seen.  An anterolateral portal was established again under direct visualization using an 18-gauge spinal needle. A 7 mm cannula was placed through this anterolateral portal.  A SLAP lesion was confirmed with a hook probe.  The nonarticular portion of superior glenoid was debrided with a 4.36m resector shaver blade. Arthrex BNIKEanchor was then placed anterior to the biceps anchor.   A 90 degree Arthrex suture lasso was then used to shuttle a single limb of this anchor through the  anterior inferior capsule.  Arthroscopic knot tying technique was then used to down the superior labrum anterior to the biceps.  An Arthrex  percutaneous drill guide was then placed to allow for placement of 2 anchors posterior to the biceps.  The 5.75 mm cannula anterior portal was then replaced with a 7.0 mm cannula.  A single limb of each of the posterior anchors was passed under the labrum using a 90 degree suture lasso.  An endoscopic knot tying technique was also used to reduce the superior labrum to the bony glenoid.  The repair was then examined with a hook probe and the labrum was found to be well reduced and stabilized.  All arthroscopic images were taken.  The glenohumeral joint was then copiously irrigated.  All arthroscopic instruments were removed.   The arthroscope was then placed in the subacromial space through the posterior portal.  A lateral portal was established.  Extensive bursitis was encountered.  Tensive subacromial debridement was performed with a 90 degree ArthroCare wand and 4.0 mm resector shaver blade.  A subacromial decompression was then performed from the lateral portal using a 5.5 mm resector shaver blade.  Final arthroscopic images were then taken.  No bursal sided rotator cuff tear was identified with a probe.  All arthroscopic instruments were removed after the subacromial space was copiously irrigated and lavaged.  The four arthroscopic portals were closed with 4-0 nylon. A dry, sterile dressing was applied to the right shoulder, along with a Polar Care sleeve. Patient's right arm was then placed in an abduction sling. The patient was awoken and brought to PACU in stable condition. I was scrubbed and present for the entire case and all sharp and instrument counts were correct at the conclusion the case. I spoke with the patient's girlfriend and her mother in the postop consultation room to let them know the operation was successful and performed without complication.      Timoteo Gaul, MD

## 2018-05-23 NOTE — Anesthesia Procedure Notes (Signed)
Anesthesia Regional Block: Interscalene brachial plexus block   Pre-Anesthetic Checklist: ,, timeout performed, Correct Patient, Correct Site, Correct Laterality, Correct Procedure, Correct Position, site marked, Risks and benefits discussed,  Surgical consent,  Pre-op evaluation,  At surgeon's request and post-op pain management  Laterality: Right  Prep: chloraprep, alcohol swabs       Needles:  Injection technique: Single-shot  Needle Type: Stimiplex     Needle Length: 5cm  Needle Gauge: 22     Additional Needles:   Procedures:, nerve stimulator,,, ultrasound used (permanent image in chart),,,,   Nerve Stimulator or Paresthesia:  Response: biceps flexion, 0.6 mA,   Additional Responses:   Narrative:  Start time: 05/23/2018 12:10 PM End time: 05/23/2018 12:20 PM Injection made incrementally with aspirations every 5 mL.  Performed by: Personally  Anesthesiologist: Yves Dill, MD  Additional Notes: Functioning IV was confirmed and monitors were applied.  A 50mm 22ga Stimuplex needle was used. Sterile prep and drape,hand hygiene and sterile gloves were used.  Negative aspiration and negative test dose prior to incremental administration of local anesthetic. The patient tolerated the procedure well.  Easy injection of 20 cc of Exparel and 10 cc of 0.5% Marcaine PF with no pain on injection.  As always, a preliminary view was taken to mark appropriate landmarks.

## 2018-05-23 NOTE — Anesthesia Preprocedure Evaluation (Addendum)
Anesthesia Evaluation  Patient identified by MRN, date of birth, ID band Patient awake    Reviewed: Allergy & Precautions, NPO status , Patient's Chart, lab work & pertinent test results  Airway Mallampati: III  TM Distance: >3 FB     Dental  (+) Caps, Teeth Intact   Pulmonary former smoker,    Pulmonary exam normal        Cardiovascular negative cardio ROS Normal cardiovascular exam     Neuro/Psych Seizures -,  negative psych ROS   GI/Hepatic GERD  ,  Endo/Other  Morbid obesity  Renal/GU negative Renal ROS  negative genitourinary   Musculoskeletal   Abdominal Normal abdominal exam  (+)   Peds negative pediatric ROS (+)  Hematology   Anesthesia Other Findings Past Medical History: No date: Fatty liver No date: GERD (gastroesophageal reflux disease) No date: Seizures (HCC)  Reproductive/Obstetrics                            Anesthesia Physical Anesthesia Plan  ASA: III  Anesthesia Plan: General   Post-op Pain Management:  Regional for Post-op pain   Induction: Intravenous  PONV Risk Score and Plan:   Airway Management Planned: Oral ETT  Additional Equipment:   Intra-op Plan:   Post-operative Plan: Extubation in OR  Informed Consent: I have reviewed the patients History and Physical, chart, labs and discussed the procedure including the risks, benefits and alternatives for the proposed anesthesia with the patient or authorized representative who has indicated his/her understanding and acceptance.   Dental advisory given  Plan Discussed with: CRNA and Surgeon  Anesthesia Plan Comments: (Interscalene block was explained to patient and the risks were discussed including , but not limited to pneumothorax, difficulty breathing, nerve damage, infection and patient accepts risks and agrees to block)       Anesthesia Quick Evaluation

## 2018-05-23 NOTE — Progress Notes (Signed)
States he feels like he can't breath  He is diminished  Oxygen sat on room air 96

## 2018-05-23 NOTE — H&P (Signed)
PREOPERATIVE H&P  Chief Complaint: Right shoulder SLAP tear  HPI: Juan Padilla is a 37 y.o. male who presents for preoperative history and physical with a diagnosis of right shoulder SLAP tear. Symptoms of pain with overhead and abduction activity are rated as moderate to severe, and have been worsening.  He has failed nonoperative management.  This is significantly impairing activities of daily living and his ability to perform at work.  He has elected for surgical fixation of the superior labral tear.   Past Medical History:  Diagnosis Date  . Fatty liver   . GERD (gastroesophageal reflux disease)   . Seizures (HCC)    Past Surgical History:  Procedure Laterality Date  . NO PAST SURGERIES     Social History   Socioeconomic History  . Marital status: Married    Spouse name: Not on file  . Number of children: Not on file  . Years of education: Not on file  . Highest education level: Not on file  Occupational History  . Not on file  Social Needs  . Financial resource strain: Not on file  . Food insecurity:    Worry: Not on file    Inability: Not on file  . Transportation needs:    Medical: Not on file    Non-medical: Not on file  Tobacco Use  . Smoking status: Former Smoker    Packs/day: 1.50    Years: 10.00    Pack years: 15.00    Types: Cigarettes    Last attempt to quit: 05/16/2014    Years since quitting: 4.0  . Smokeless tobacco: Never Used  Substance and Sexual Activity  . Alcohol use: Yes    Comment: OCC  . Drug use: No  . Sexual activity: Not on file  Lifestyle  . Physical activity:    Days per week: Not on file    Minutes per session: Not on file  . Stress: Not on file  Relationships  . Social connections:    Talks on phone: Not on file    Gets together: Not on file    Attends religious service: Not on file    Active member of club or organization: Not on file    Attends meetings of clubs or organizations: Not on file    Relationship status: Not  on file  Other Topics Concern  . Not on file  Social History Narrative  . Not on file   History reviewed. No pertinent family history. No Known Allergies Prior to Admission medications   Medication Sig Start Date End Date Taking? Authorizing Provider  acetaminophen (TYLENOL) 500 MG tablet Take 1,000 mg by mouth every 6 (six) hours as needed (for pain.).   Yes [provider]  ranitidine (ZANTAC) 150 MG tablet Take 150 mg by mouth as needed for heartburn.   Yes [provider]     Positive ROS: All other systems have been reviewed and were otherwise negative with the exception of those mentioned in the HPI and as above.  Physical Exam: General: Alert, no acute distress Cardiovascular: Regular rate and rhythm, no murmurs rubs or gallops.  No pedal edema Respiratory: Clear to auscultation bilaterally, no wheezes rales or rhonchi. No cyanosis, no use of accessory musculature GI: No organomegaly, abdomen is soft and non-tender nondistended with positive bowel sounds. Skin: Skin intact, no lesions within the operative field. Neurologic: Sensation intact distally Psychiatric: Patient is competent for consent with normal mood and affect Lymphatic: No cervical lymphadenopathy  MUSCULOSKELETAL:  Right shoulder: Skin is intact.  There is no erythema ecchymosis swelling or deformity.  Patient has no muscle atrophy.  He can forward elevate and abduct to approximately 160 degrees with pain in full forward elevation in the mid range of abduction.  His pain with a more directed force to his abducted shoulder.  He has a positive O'Brien's test.  He has pain with impingement testing but no apprehension or instability.  He is full digital wrist and elbow range of motion, intact sensation light touch and a palpable radial pulse.  He demonstrates no detectable weakness of the rotator cuff.  Assessment: Right shoulder superior labral tear (SLAP)   Plan: Plan for Procedure(s): RIGHT  SHOULDER ARTHROSCOPY WITH LABRAL REPAIR  I reviewed the details of the superior labral repair surgery with the patient as well as the postoperative course.  He understands will include 4 weeks in a sling and limitation of motion for a total of 8 weeks.  Will recovery will take 4 to 6 months.  I discussed the risks and benefits of surgery with the patient.  He understands the risks include but are not limited to infection, bleeding, nerve or blood vessel injury, joint stiffness or loss of motion, persistent pain, weakness or instability, re-tear of the superior labrum and hardware failure and the need for further surgery. Medical risks include but are not limited to DVT and pulmonary embolism, myocardial infarction, stroke, pneumonia, respiratory failure and death. Patient understood these risks and wished to proceed.     Juan Fairly, MD   05/23/2018 1:29 PM

## 2018-05-23 NOTE — Anesthesia Procedure Notes (Signed)
Procedure Name: Intubation Performed by: Wendell Fiebig, CRNA Pre-anesthesia Checklist: Patient identified, Patient being monitored, Timeout performed, Emergency Drugs available and Suction available Patient Re-evaluated:Patient Re-evaluated prior to induction Oxygen Delivery Method: Circle system utilized Preoxygenation: Pre-oxygenation with 100% oxygen Induction Type: IV induction Ventilation: Mask ventilation without difficulty Laryngoscope Size: Mac and 3 Grade View: Grade I Tube type: Oral Tube size: 7.5 mm Number of attempts: 1 Airway Equipment and Method: Stylet Placement Confirmation: ETT inserted through vocal cords under direct vision,  positive ETCO2 and breath sounds checked- equal and bilateral Secured at: 23 cm Tube secured with: Tape Dental Injury: Teeth and Oropharynx as per pre-operative assessment        

## 2018-05-23 NOTE — Discharge Instructions (Signed)
Interscalene Nerve Block with Exparel ° °1.  For your surgery you have received an Interscalene Nerve Block with Exparel. °2. Nerve Blocks affect many types of nerves, including nerves that control movement, pain and normal sensation.  You may experience feelings such as numbness, tingling, heaviness, weakness or the inability to move your arm or the feeling or sensation that your arm has "fallen asleep". °3. A nerve block with Exparel can last up to 5 days.  Usually the weakness wears off first.  The tingling and heaviness usually wear off next.  Finally you may start to notice pain.  Keep in mind that this may occur in any order.  Once a nerve block starts to wear off it is usually completely gone within 60 minutes. °4. ISNB may cause mild shortness of breath, a hoarse voice, blurry vision, unequal pupils, or drooping of the face on the same side as the nerve block.  These symptoms will usually resolve with the numbness.  Very rarely the procedure itself can cause mild seizures. °5. If needed, your surgeon will give you a prescription for pain medication.  It will take about 60 minutes for the oral pain medication to become fully effective.  So, it is recommended that you start taking this medication before the nerve block first begins to wear off, or when you first begin to feel discomfort. °6. Take your pain medication only as prescribed.  Pain medication can cause sedation and decrease your breathing if you take more than you need for the level of pain that you have. °7. Nausea is a common side effect of many pain medications.  You may want to eat something before taking your pain medicine to prevent nausea. °8. After an Interscalene nerve block, you cannot feel pain, pressure or extremes in temperature in the effected arm.  Because your arm is numb it is at an increased risk for injury.  To decrease the possibility of injury, please practice the following: ° °a. While you are awake change the position of  your arm frequently to prevent too much pressure on any one area for prolonged periods of time. °b.  If you have a cast or tight dressing, check the color or your fingers every couple of hours.  Call your surgeon with the appearance of any discoloration (white or blue). °c. If you are given a sling to wear before you go home, please wear it  at all times until the block has completely worn off.  Do not get up at night without your sling. °d. Please contact [facility] or your surgeon if you do not begin to regain sensation after 7 days from the surgery.  Anesthesia may be contacted by calling the Same Day Surgery Department, Mon. through Fri., 6 am to 4 pm at [phone].   °e. If you experience any other problems or concerns, please contact your surgeon's office. °If you experience severe or prolonged shortness of breath go to the nearest emergency department. ° ° ° °AMBULATORY SURGERY  °DISCHARGE INSTRUCTIONS ° ° °1) The drugs that you were given will stay in your system until tomorrow so for the next 24 hours you should not: ° °A) Drive an automobile °B) Make any legal decisions °C) Drink any alcoholic beverage ° ° °2) You may resume regular meals tomorrow.  Today it is better to start with liquids and gradually work up to solid foods. ° °You may eat anything you prefer, but it is better to start with liquids, then soup   and crackers, and gradually work up to solid foods. ° ° °3) Please notify your doctor immediately if you have any unusual bleeding, trouble breathing, redness and pain at the surgery site, drainage, fever, or pain not relieved by medication. °4)  ° °5) Your post-operative visit with Dr.                     °           °     is: Date:                        Time:   ° °Please call to schedule your post-operative visit. ° °6) Additional Instructions: ° °

## 2018-05-23 NOTE — Anesthesia Post-op Follow-up Note (Signed)
Anesthesia QCDR form completed.        

## 2018-05-24 ENCOUNTER — Encounter: Payer: Self-pay | Admitting: Orthopedic Surgery

## 2018-05-27 NOTE — Anesthesia Postprocedure Evaluation (Signed)
Anesthesia Post Note  Patient: Juan Padilla  Procedure(s) Performed: SHOULDER ARTHROSCOPY WITH LABRAL REPAIR,SUBACROMINAL DECOMPRESSION (Right Shoulder)  Patient location during evaluation: PACU Anesthesia Type: General Level of consciousness: awake and alert Pain management: pain level controlled Vital Signs Assessment: post-procedure vital signs reviewed and stable Respiratory status: spontaneous breathing, nonlabored ventilation, respiratory function stable and patient connected to nasal cannula oxygen Cardiovascular status: blood pressure returned to baseline and stable Postop Assessment: no apparent nausea or vomiting Anesthetic complications: no     Last Vitals:  Vitals:   05/23/18 1733 05/23/18 1806  BP:  132/86  Pulse: 89 92  Resp: 16 16  Temp: (!) 36.1 C   SpO2: 97% 97%    Last Pain:  Vitals:   05/24/18 1000  TempSrc:   PainSc: 0-No pain                 Lenard Simmer

## 2020-02-22 ENCOUNTER — Emergency Department: Payer: BC Managed Care – PPO

## 2020-02-22 ENCOUNTER — Other Ambulatory Visit: Payer: Self-pay

## 2020-02-22 ENCOUNTER — Emergency Department
Admission: EM | Admit: 2020-02-22 | Discharge: 2020-02-22 | Disposition: A | Discharge status: Home / Self Care | Payer: BC Managed Care – PPO | Attending: Emergency Medicine | Admitting: Emergency Medicine

## 2020-02-22 DIAGNOSIS — K55069 Acute infarction of intestine, part and extent unspecified: Secondary | ICD-10-CM

## 2020-02-22 DIAGNOSIS — F17210 Nicotine dependence, cigarettes, uncomplicated: Secondary | ICD-10-CM | POA: Insufficient documentation

## 2020-02-22 DIAGNOSIS — R1031 Right lower quadrant pain: Secondary | ICD-10-CM

## 2020-02-22 LAB — CBC
HCT: 42.9 % (ref 39.0–52.0)
Hemoglobin: 15.3 g/dL (ref 13.0–17.0)
MCH: 31.1 pg (ref 26.0–34.0)
MCHC: 35.7 g/dL (ref 30.0–36.0)
MCV: 87.2 fL (ref 80.0–100.0)
Platelets: 270 10*3/uL (ref 150–400)
RBC: 4.92 MIL/uL (ref 4.22–5.81)
RDW: 12.1 % (ref 11.5–15.5)
WBC: 8.1 10*3/uL (ref 4.0–10.5)
nRBC: 0 % (ref 0.0–0.2)

## 2020-02-22 LAB — COMPREHENSIVE METABOLIC PANEL
ALT: 75 U/L — ABNORMAL HIGH (ref 0–44)
AST: 40 U/L (ref 15–41)
Albumin: 4.3 g/dL (ref 3.5–5.0)
Alkaline Phosphatase: 58 U/L (ref 38–126)
Anion gap: 9 (ref 5–15)
BUN: 9 mg/dL (ref 6–20)
CO2: 24 mmol/L (ref 22–32)
Calcium: 8.9 mg/dL (ref 8.9–10.3)
Chloride: 104 mmol/L (ref 98–111)
Creatinine, Ser: 1.05 mg/dL (ref 0.61–1.24)
GFR calc Af Amer: >60 mL/min (ref >60)
GFR calc non Af Amer: >60 mL/min (ref >60)
Glucose, Bld: 144 mg/dL — ABNORMAL HIGH (ref 70–99)
Potassium: 3.8 mmol/L (ref 3.5–5.1)
Sodium: 137 mmol/L (ref 135–145)
Total Bilirubin: 0.8 mg/dL (ref 0.3–1.2)
Total Protein: 7.8 g/dL (ref 6.5–8.1)

## 2020-02-22 LAB — URINALYSIS, COMPLETE (UACMP) WITH MICROSCOPIC
Bacteria, UA: NONE SEEN
Bilirubin Urine: NEGATIVE
Glucose, UA: NEGATIVE mg/dL
Hgb urine dipstick: NEGATIVE
Ketones, ur: NEGATIVE mg/dL
Leukocytes,Ua: NEGATIVE
Nitrite: NEGATIVE
Protein, ur: NEGATIVE mg/dL
Specific Gravity, Urine: 1.014 (ref 1.005–1.030)
Squamous Epithelial / LPF: NONE SEEN (ref 0–5)
pH: 6 (ref 5.0–8.0)

## 2020-02-22 LAB — LIPASE, BLOOD: Lipase: 28 U/L (ref 11–51)

## 2020-02-22 MED ORDER — IOHEXOL 300 MG/ML  SOLN
125.0000 mL | Freq: Once | INTRAMUSCULAR | Status: AC | PRN
  Administered 2020-02-22: 125 mL via INTRAVENOUS

## 2020-02-22 NOTE — ED Triage Notes (Signed)
Pt comes POV with abdominal pain for a week. Starts at [employer] and goes to right side. Denies any n/v/d.

## 2020-02-22 NOTE — ED Notes (Signed)
Pt c/o right lower quadrant pain, c/o hunger.

## 2020-02-22 NOTE — ED Provider Notes (Signed)
[facility] Emergency Department Provider Note   ____________________________________________   First MD Initiated Contact with Patient 02/22/20 1631     (approximate)  I have reviewed the triage vital signs and the nursing notes.   HISTORY  Chief Complaint Abdominal Pain    HPI Juan Padilla is a 39 y.o. male with past medical history of GERD and seizures who presents to the ED complaining of abdominal pain.  Patient reports he has had approximately 6 days of intermittent abdominal pain.  It is primarily focused around his umbilicus but can radiate to his right lower quadrant and is exacerbated by certain positions.  He denies any associated nausea or vomiting, has been eating and drinking normally and denies any changes to his bowel movements.  He has not had any fevers and denies any dysuria or hematuria.  He does state he will occasionally have some burning moving up into the center of his chest, but he he has not taken any medication for heartburn or reflux.  He denies any abdominal surgical history.        Past Medical History:  Diagnosis Date  . Fatty liver   . GERD (gastroesophageal reflux disease)   . Seizures ([place])     There are no problems to display for this patient.   Past Surgical History:  Procedure Laterality Date  . NO PAST SURGERIES    . SHOULDER ARTHROSCOPY WITH LABRAL REPAIR Right 05/23/2018   Procedure: SHOULDER ARTHROSCOPY WITH LABRAL REPAIR,SUBACROMINAL DECOMPRESSION;  Surgeon: Thornton Park, MD;  Location: [facility] ORS;  Service: Orthopedics;  Laterality: Right;    Prior to Admission medications   Medication Sig Start Date End Date Taking? Authorizing Provider  acetaminophen (TYLENOL) 500 MG tablet Take 1,000 mg by mouth every 6 (six) hours as needed (for pain.).    [provider]  ondansetron (ZOFRAN) 4 MG tablet Take 1 tablet (4 mg total) by mouth every 8 (eight) hours as needed for nausea or vomiting. 05/23/18    Thornton Park, MD  oxyCODONE (OXY IR/ROXICODONE) 5 MG immediate release tablet Take 1 tablet (5 mg total) by mouth every 4 (four) hours as needed. 05/23/18   Thornton Park, MD  ranitidine (ZANTAC) 150 MG tablet Take 150 mg by mouth as needed for heartburn.    [provider]    Allergies Patient has no known allergies.  History reviewed. No pertinent family history.  Social History Social History   Tobacco Use  . Smoking status: Former Smoker    Packs/day: 1.50    Years: 10.00    Pack years: 15.00    Types: Cigarettes    Quit date: 05/16/2014    Years since quitting: 5.7  . Smokeless tobacco: Never Used  Vaping Use  . Vaping Use: Never used  Substance Use Topics  . Alcohol use: Yes    Comment: OCC  . Drug use: No    Review of Systems  Constitutional: No fever/chills Eyes: No visual changes. ENT: No sore throat. Cardiovascular: Denies chest pain. Respiratory: Denies shortness of breath. Gastrointestinal: Positive for abdominal pain.  No nausea, no vomiting.  No diarrhea.  No constipation. Genitourinary: Negative for dysuria. Musculoskeletal: Negative for back pain. Skin: Negative for rash. Neurological: Negative for headaches, focal weakness or numbness.  ____________________________________________   PHYSICAL EXAM:  VITAL SIGNS: ED Triage Vitals [02/22/20 1357]  Enc Vitals Group     BP (!) 159/101     Pulse Rate (!) 103     Resp 18  Temp 99.1 F (37.3 C)     Temp Source Oral     SpO2 98 %     Weight (!) 311 lb (141.1 kg)     Height 5\' 8"  (1.727 m)     Head Circumference      Peak Flow      Pain Score 8     Pain Loc      Pain Edu?      Excl. in GC?     Constitutional: Alert and oriented. Eyes: Conjunctivae are normal. Head: Atraumatic. Nose: No congestion/rhinnorhea. Mouth/Throat: Mucous membranes are moist. Neck: Normal ROM Cardiovascular: Normal rate, regular rhythm. Grossly normal heart sounds. Respiratory: Normal  respiratory effort.  No retractions. Lungs CTAB. Gastrointestinal: Soft and tender to palpation in the right lower quadrant with no rebound or guarding. No distention. Genitourinary: deferred Musculoskeletal: No lower extremity tenderness nor edema. Neurologic:  Normal speech and language. No gross focal neurologic deficits are appreciated. Skin:  Skin is warm, dry and intact. No rash noted. Psychiatric: Mood and affect are normal. Speech and behavior are normal.  ____________________________________________   LABS (all labs ordered are listed, but only abnormal results are displayed)  Labs Reviewed  COMPREHENSIVE METABOLIC PANEL - Abnormal; Notable for the following components:      Result Value   Glucose, Bld 144 (*)    ALT 75 (*)    All other components within normal limits  URINALYSIS, COMPLETE (UACMP) WITH MICROSCOPIC - Abnormal; Notable for the following components:   Color, Urine YELLOW (*)    APPearance CLEAR (*)    All other components within normal limits  LIPASE, BLOOD  CBC    PROCEDURES  Procedure(s) performed (including Critical Care):  Procedures   ____________________________________________   INITIAL IMPRESSION / ASSESSMENT AND PLAN / ED COURSE       39 year old male with past medical history of GERD and seizures who presents to the ED complaining of 6 days of periumbilical abdominal pain radiating to his right lower quadrant.  He has some tenderness in his right lower quadrant on exam and we will further assess for appendicitis with CT scan.  Lab work thus far is reassuring, if CT is negative then I suspect he may have an element of gastritis/GERD contributing to pain.  He declines any pain medication at this time.  CT scan is negative for appendicitis, does show nonspecific inflammation in the area of his pain, potentially representing omental infarct.  Visualized bowel in that area is normal in appearance, no evidence of bowel ischemia or obstruction.   This may be managed conservatively with over-the-counter pain medications, patient is appropriate for outpatient management and will be provided with referral to general surgery.  His pain remains well controlled at this time.  He was counseled to return to the ED for new worsening symptoms, patient agrees with plan.      ____________________________________________   FINAL CLINICAL IMPRESSION(S) / ED DIAGNOSES  Final diagnoses:  Right lower quadrant abdominal pain  Omental infarction [facility])     ED Discharge Orders    None       Note:  This document was prepared using Dragon voice recognition software and may include unintentional dictation errors.   [facility], MD 02/22/20 [phone]

## 2020-12-19 IMAGING — CT CT ABD-PELV W/ CM
2 of 4 series · 16 of 46 positions shown, 18 images · IV contrast (omnipaque)
Comparison: 01/05/2017

CLINICAL DATA: RIGHT lower quadrant pain for 1 week, begins at
umbilicus and goes to RIGHT-side, question appendicitis

EXAM:
CT ABDOMEN AND PELVIS WITH CONTRAST
TECHNIQUE: Multidetector CT imaging of the abdomen and pelvis was performed
using the standard protocol following bolus administration of
intravenous contrast. Sagittal and coronal MPR images reconstructed
from axial data set.
CONTRAST:  125mL OMNIPAQUE IOHEXOL 300 MG/ML SOLN IV. No oral
contrast.

[Series 2: routine abd/pel with · axial · 0.98mm/px · z∈[-566,-62]mm · 13 of 111 slices shown, 15 images]
[im 5/111  soft-tissue]
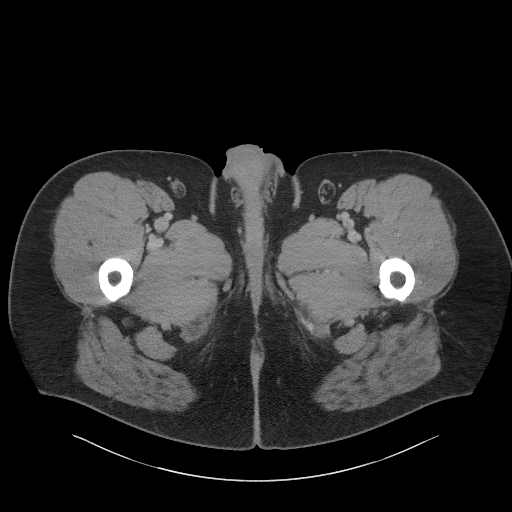
[im 5/111  bone]
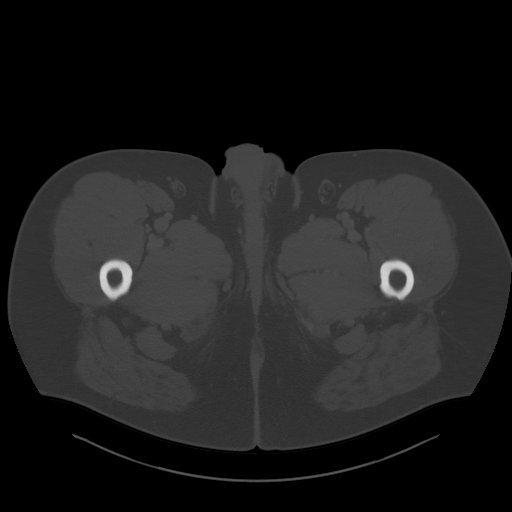
[im 15/111  soft-tissue]
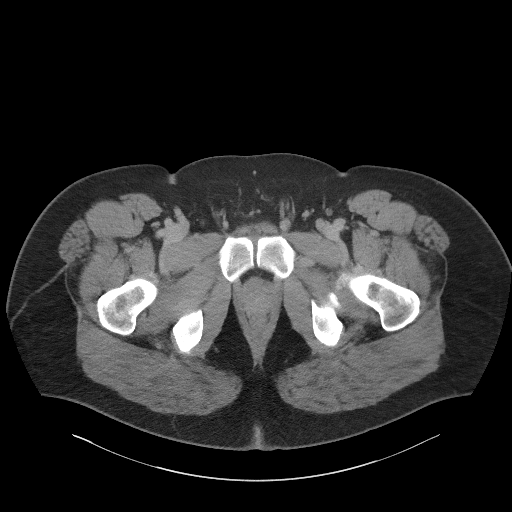
[im 24/111  soft-tissue]
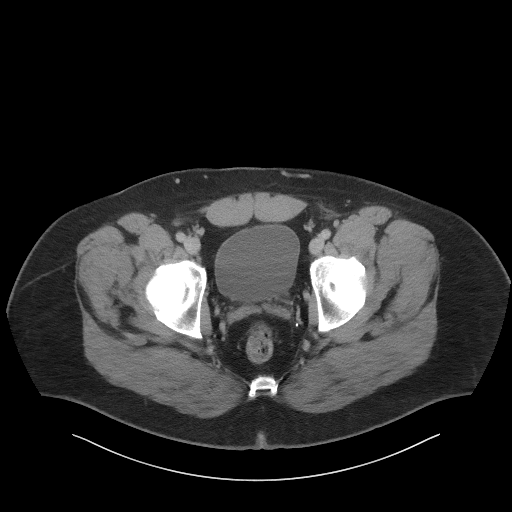
[im 29/111  soft-tissue]
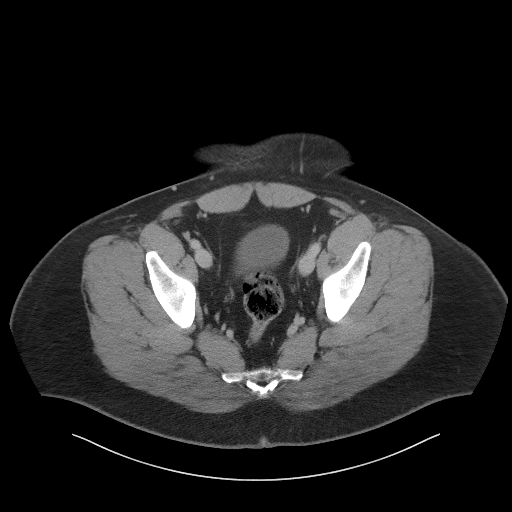
[im 39/111  soft-tissue]
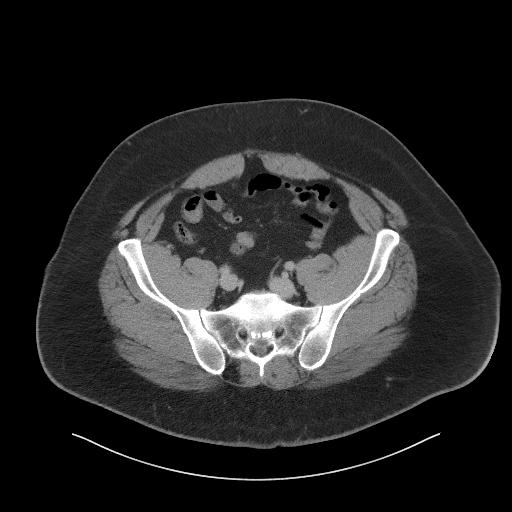
[im 48/111  soft-tissue]
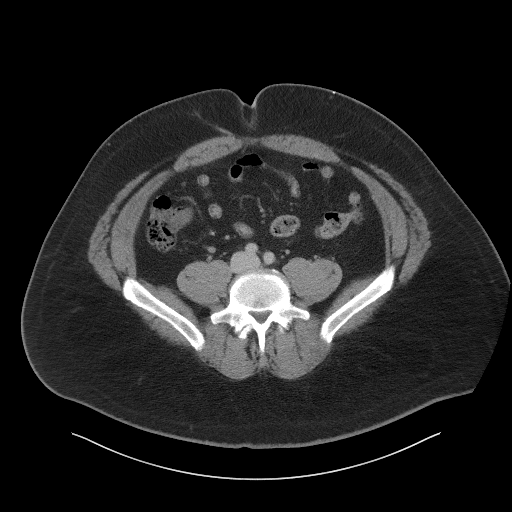
[im 58/111  soft-tissue]
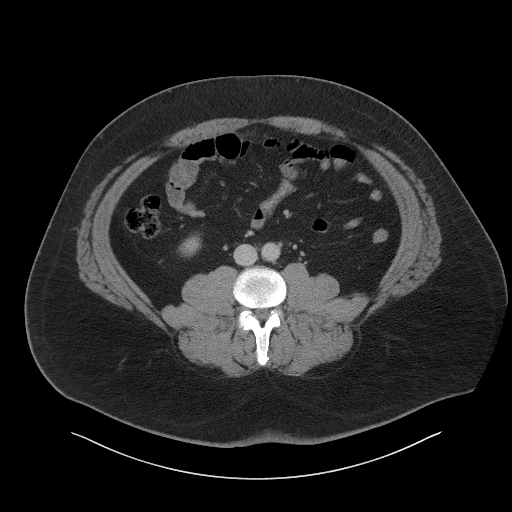
[im 63/111  soft-tissue]
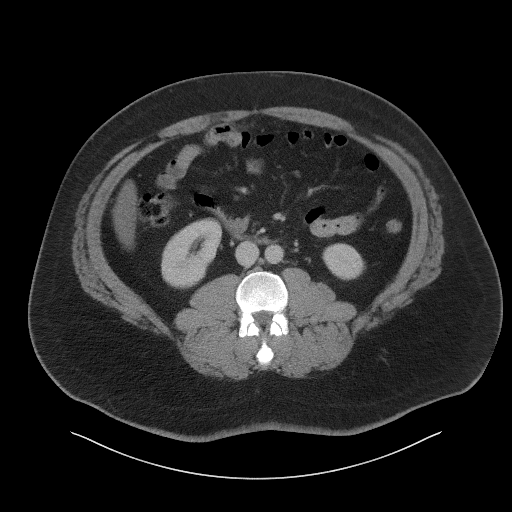
[im 72/111  soft-tissue]
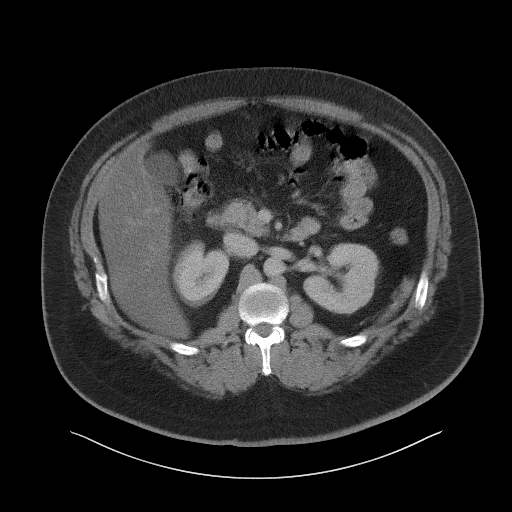
[im 72/111  bone]
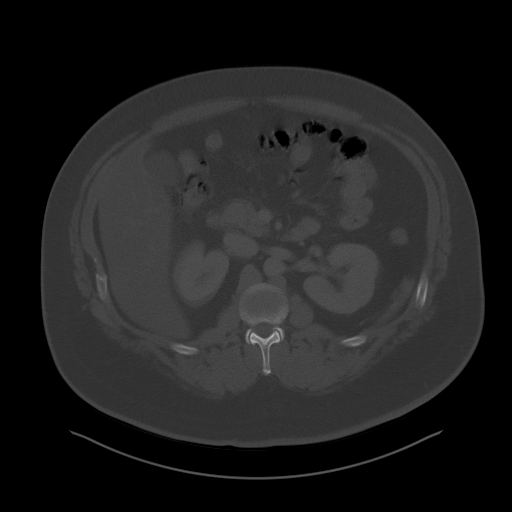
[im 82/111  soft-tissue]
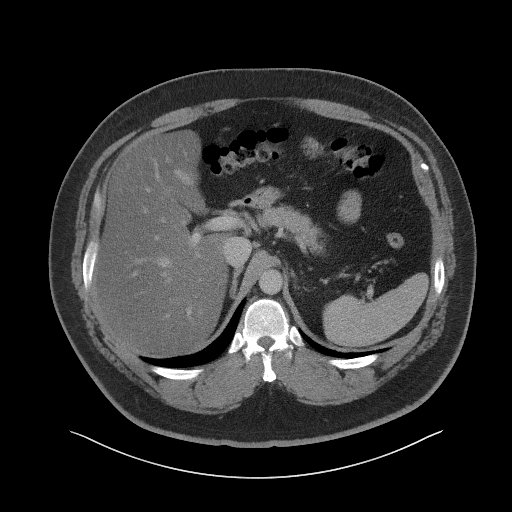
[im 87/111  soft-tissue]
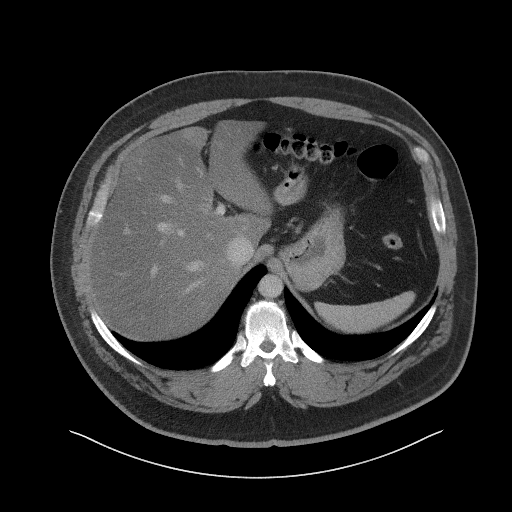
[im 96/111  soft-tissue]
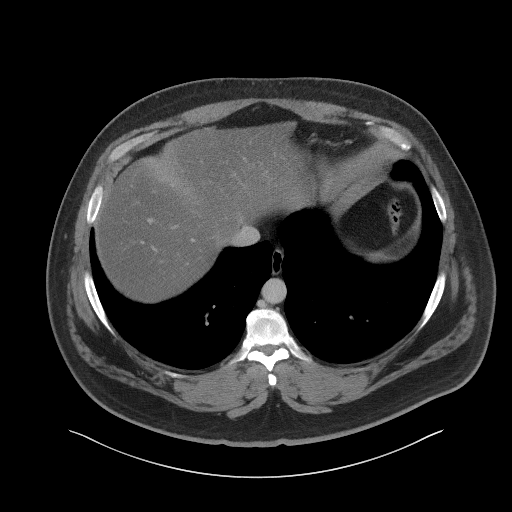
[im 106/111  soft-tissue]
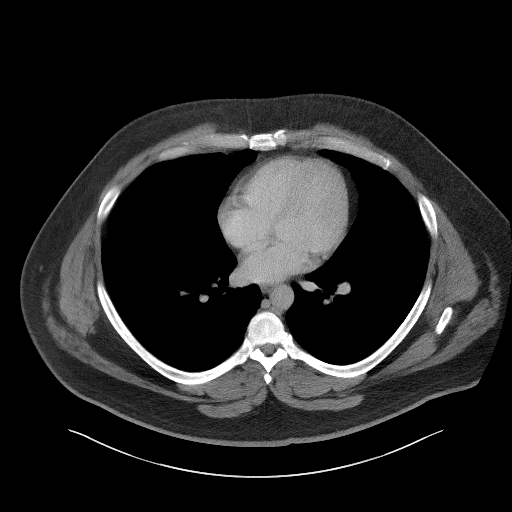

[Series 5: coronal st · coronal · 0.91mm/px · 3 of 123 slices shown]
[im 41/123  soft-tissue]
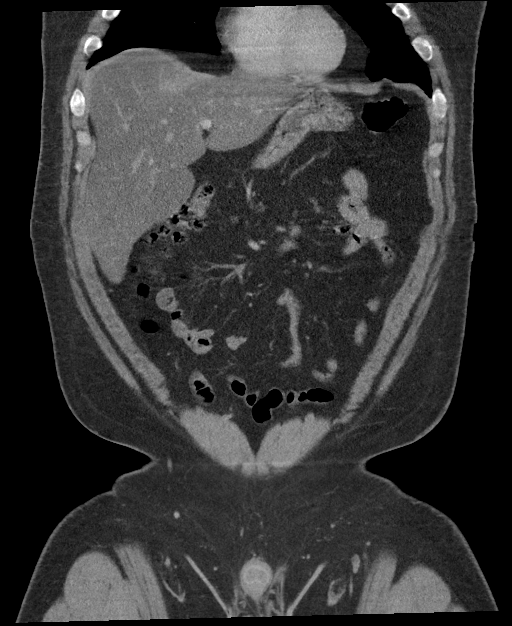
[im 55/123  soft-tissue]
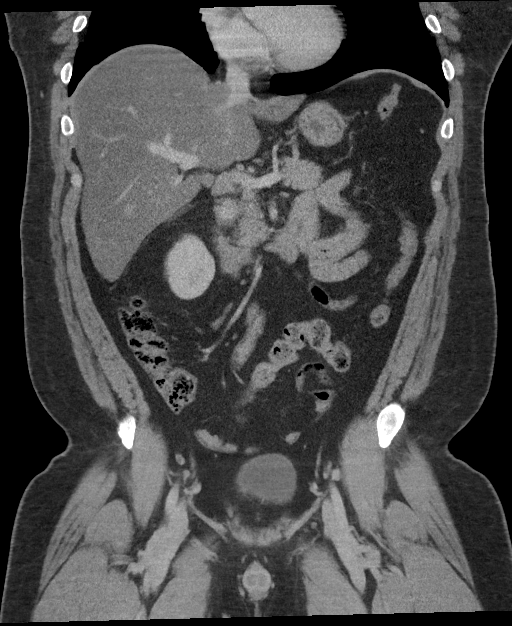
[im 68/123  soft-tissue]
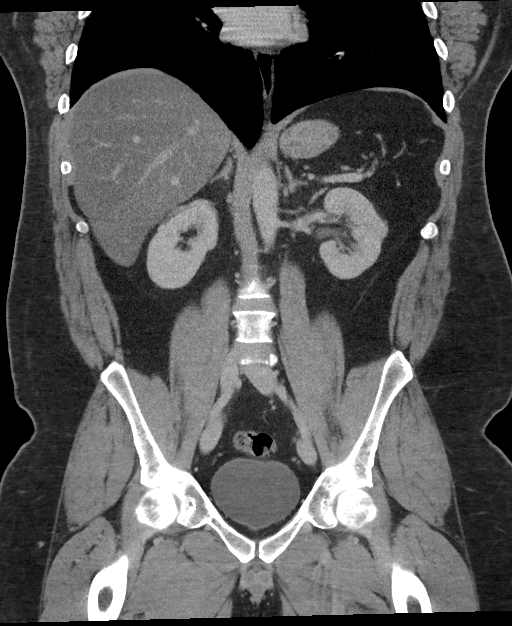

[16 of 46 positions shown; findings below may reference images not displayed]

FINDINGS: Lower chest: Lung bases clear

Hepatobiliary: Fatty infiltration of liver with scattered mild focal
sparing. No gallbladder or additional hepatic abnormalities.

Pancreas: Normal appearance

Spleen: Normal appearance

Adrenals/Urinary Tract: Probable tiny LEFT renal cyst, unchanged.
Adrenal glands, kidneys, ureters, and bladder otherwise normal
appearance

Stomach/Bowel: Normal appendix. Stomach and bowel loops normal
appearance.

Vascular/Lymphatic: Aorta normal caliber. Vascular structures
patent. No adenopathy.

Reproductive: Unremarkable prostate gland and seminal vesicles

Other: Small umbilical hernia containing fat. Superior and RIGHT
lateral to the umbilicus, a focus of infiltration of anterior
abdominal fat is identified, measuring 2.0 x 2.0 x 1.0 cm. This is
adjacent to several small bowel loops, which are normal in
appearance. This appears to represent a focal inflammatory process,
could represent fat necrosis, small a mental infarct or nonspecific
inflammation. No free air or free fluid.

Musculoskeletal: Osseous structures unremarkable.
IMPRESSION: Normal appendix.

Small umbilical hernia containing fat.

Small focus of infiltration of infiltration anterior abdomen [DATE] x
2.0 x 1.0 cm in size adjacent to several small bowel loops superior
RIGHT lateral to the umbilicus, question fat necrosis, small focus
of omental infarct, or other nonspecific inflammatory process.

Adjacent bowel loops are unremarkable and no evidence of bowel
perforation or obstruction is identified.

Remainder of exam unremarkable.

## 2021-03-20 ENCOUNTER — Other Ambulatory Visit: Payer: Self-pay

## 2021-03-20 ENCOUNTER — Encounter: Payer: Self-pay | Admitting: Emergency Medicine

## 2021-03-20 DIAGNOSIS — W57XXXA Bitten or stung by nonvenomous insect and other nonvenomous arthropods, initial encounter: Secondary | ICD-10-CM | POA: Diagnosis not present

## 2021-03-20 DIAGNOSIS — S80862A Insect bite (nonvenomous), left lower leg, initial encounter: Secondary | ICD-10-CM | POA: Insufficient documentation

## 2021-03-20 DIAGNOSIS — F17210 Nicotine dependence, cigarettes, uncomplicated: Secondary | ICD-10-CM | POA: Diagnosis not present

## 2021-03-20 NOTE — ED Triage Notes (Signed)
Patient to ED POV for insect bite since Thursday morning. Patient states clear drainage coming from site. Patient ambulatory to triage.

## 2021-03-21 ENCOUNTER — Emergency Department
Admission: EM | Admit: 2021-03-21 | Discharge: 2021-03-21 | Disposition: A | Discharge status: Home / Self Care | Payer: BC Managed Care – PPO | Attending: Emergency Medicine | Admitting: Emergency Medicine

## 2021-03-21 DIAGNOSIS — W57XXXA Bitten or stung by nonvenomous insect and other nonvenomous arthropods, initial encounter: Secondary | ICD-10-CM

## 2021-03-21 MED ORDER — CEPHALEXIN 500 MG PO CAPS
500.0000 mg | ORAL_CAPSULE | Freq: Once | ORAL | Status: AC
  Administered 2021-03-21: 500 mg via ORAL
  Filled 2021-03-21: qty 1

## 2021-03-21 MED ORDER — BACITRACIN ZINC 500 UNIT/GM EX OINT
TOPICAL_OINTMENT | Freq: Once | CUTANEOUS | Status: AC
  Filled 2021-03-21: qty 0.9

## 2021-03-21 MED ORDER — CEPHALEXIN 500 MG PO CAPS: 500.0000 mg | ORAL_CAPSULE | Freq: Three times a day (TID) | ORAL | 0 refills | Status: AC

## 2021-03-21 NOTE — ED Provider Notes (Signed)
[facility] Emergency Department Provider Note   ____________________________________________   Event Date/Time   First MD Initiated Contact with Patient 03/21/21 [phone]     (approximate)  I have reviewed the triage vital signs and the nursing notes.   HISTORY  Chief Complaint Insect Bite    HPI Juan Padilla is a 40 y.o. male who presents to the ED from home with a chief complaint of insect bite.  Patient was bitten on the left lower leg several nights ago.  Complains of patchy itchy area and new ulceration.  Denies fever, chills, nausea or vomiting.  Area is completely covered with a microfiber bandage.     Past Medical History:  Diagnosis Date   Fatty liver    GERD (gastroesophageal reflux disease)    Seizures ([facility])     There are no problems to display for this patient.   Past Surgical History:  Procedure Laterality Date   NO PAST SURGERIES     SHOULDER ARTHROSCOPY WITH LABRAL REPAIR Right 05/23/2018   Procedure: SHOULDER ARTHROSCOPY WITH LABRAL REPAIR,SUBACROMINAL DECOMPRESSION;  Surgeon: [name], MD;  Location: [facility] ORS;  Service: Orthopedics;  Laterality: Right;    Prior to Admission medications   Medication Sig Start Date End Date Taking? Authorizing Provider  cephALEXin (KEFLEX) 500 MG capsule Take 1 capsule (500 mg total) by mouth 3 (three) times daily. 03/21/21  Yes Irean Hong, MD  acetaminophen (TYLENOL) 500 MG tablet Take 1,000 mg by mouth every 6 (six) hours as needed (for pain.).    [provider]  ondansetron (ZOFRAN) 4 MG tablet Take 1 tablet (4 mg total) by mouth every 8 (eight) hours as needed for nausea or vomiting. 05/23/18   [name], MD  oxyCODONE (OXY IR/ROXICODONE) 5 MG immediate release tablet Take 1 tablet (5 mg total) by mouth every 4 (four) hours as needed. 05/23/18   [name], MD  ranitidine (ZANTAC) 150 MG tablet Take 150 mg by mouth as needed for heartburn.    [provider]    Allergies Patient has no known allergies.  No family history on file.  Social History Social History   Tobacco Use   Smoking status: Former    Packs/day: 1.50    Years: 10.00    Pack years: 15.00    Types: Cigarettes    Quit date: 05/16/2014    Years since quitting: 6.8   Smokeless tobacco: Never  Vaping Use   Vaping Use: Never used  Substance Use Topics   Alcohol use: Yes    Comment: OCC   Drug use: No    Review of Systems  Constitutional: No fever/chills Eyes: No visual changes. ENT: No sore throat. Cardiovascular: Denies chest pain. Respiratory: Denies shortness of breath. Gastrointestinal: No abdominal pain.  No nausea, no vomiting.  No diarrhea.  No constipation. Genitourinary: Negative for dysuria. Musculoskeletal: Negative for back pain. Skin: Positive for infected bug bite.  Negative for rash. Neurological: Negative for headaches, focal weakness or numbness.   ____________________________________________   PHYSICAL EXAM:  VITAL SIGNS: ED Triage Vitals [03/20/21 2215]  Enc Vitals Group     BP (!) 158/98     Pulse Rate 84     Resp 18     Temp 98.1 F (36.7 C)     Temp Source Oral     SpO2 96 %     Weight 283 lb (128.4 kg)     Height 5\' 8"  (1.727 m)  Head Circumference      Peak Flow      Pain Score 0     Pain Loc      Pain Edu?      Excl. in GC?     Constitutional: Alert and oriented. Well appearing and in no acute distress. Eyes: Conjunctivae are normal. PERRL. EOMI. Head: Atraumatic. Nose: No congestion/rhinnorhea. Mouth/Throat: Mucous membranes are moist.   Neck: No stridor.   Cardiovascular: Normal rate, regular rhythm. Grossly normal heart sounds.  Good peripheral circulation. Respiratory: Normal respiratory effort.  No retractions. Lungs CTAB. Gastrointestinal: Soft and nontender. No distention. No abdominal bruits. No CVA tenderness. Musculoskeletal: Small skin ulceration near left medial malleolus with patchy vesicular  area to skin above.  No warmth/erythema/streaking.  2+ distal pulses.  Brisk, less than 5-second capillary refill.. Neurologic:  Normal speech and language. No gross focal neurologic deficits are appreciated. No gait instability. Skin:  Skin is warm, dry and intact. No rash noted. Psychiatric: Mood and affect are normal. Speech and behavior are normal.  ____________________________________________   LABS (all labs ordered are listed, but only abnormal results are displayed)  Labs Reviewed - No data to display ____________________________________________  EKG  None ____________________________________________  RADIOLOGY I, Kalev Temme J, personally viewed and evaluated these images (plain radiographs) as part of my medical decision making, as well as reviewing the written report by the radiologist.  ED MD interpretation: None  Official radiology report(s): No results found.  ____________________________________________   PROCEDURES  Procedure(s) performed (including Critical Care):  Procedures   ____________________________________________   INITIAL IMPRESSION / ASSESSMENT AND PLAN / ED COURSE  As part of my medical decision making, I reviewed the following data within the electronic MEDICAL RECORD NUMBER Nursing notes reviewed and incorporated and Notes from prior ED visits     40 year old male presenting with infected insect bite.  Patchy vesicular area also resembles poison ivy/oak.  However, given small limited area would not advise steroids.  Will treat with Keflex, encourage patient to not completely cover wound with bandage until it is skin breathe.  Strict return precautions given.  Patient verbalizes understanding agrees with plan of care.      ____________________________________________   FINAL CLINICAL IMPRESSION(S) / ED DIAGNOSES  Final diagnoses:  Bug bite with infection, initial encounter     ED Discharge Orders          Ordered    cephALEXin  (KEFLEX) 500 MG capsule  3 times daily        03/21/21 0252             Note:  This document was prepared using Dragon voice recognition software and may include unintentional dictation errors.    Irean Hong, MD 03/21/21 [phone]

## 2021-03-21 NOTE — Discharge Instructions (Addendum)
Take antibiotic as prescribed (Keflex 500 mg  3 times daily x7 days).  Keep wound clean and dry.  Return to the ER for worsening symptoms, fevers, purulent discharge or other concerns.

## 2021-03-21 NOTE — ED Notes (Signed)
Wound cleaned and bacitracin applied

## 2021-08-15 ENCOUNTER — Other Ambulatory Visit: Payer: Self-pay

## 2021-08-15 ENCOUNTER — Emergency Department
Admission: EM | Admit: 2021-08-15 | Discharge: 2021-08-15 | Disposition: A | Discharge status: Home / Self Care | Payer: BC Managed Care – PPO | Attending: Emergency Medicine | Admitting: Emergency Medicine

## 2021-08-15 DIAGNOSIS — U071 COVID-19: Secondary | ICD-10-CM | POA: Diagnosis not present

## 2021-08-15 DIAGNOSIS — Z87891 Personal history of nicotine dependence: Secondary | ICD-10-CM | POA: Insufficient documentation

## 2021-08-15 DIAGNOSIS — R509 Fever, unspecified: Secondary | ICD-10-CM | POA: Diagnosis present

## 2021-08-15 DIAGNOSIS — B349 Viral infection, unspecified: Secondary | ICD-10-CM

## 2021-08-15 LAB — RESP PANEL BY RT-PCR (FLU A&B, COVID) ARPGX2
Influenza A by PCR: NEGATIVE
Influenza B by PCR: NEGATIVE
SARS Coronavirus 2 by RT PCR: POSITIVE — AB

## 2021-08-15 NOTE — Discharge Instructions (Addendum)
You likely have a viral illness.  You can use MyChart to see if your COVID or flu test is positive.  Please take Tylenol Motrin for fever and body aches.

## 2021-08-15 NOTE — ED Notes (Signed)
Patient discharged to home per MD order. Patient in stable condition, and deemed medically cleared by ED provider for discharge. Discharge instructions reviewed with patient/family using "Teach Back"; verbalized understanding of medication education and administration, and information about follow-up care. Denies further concerns. ° °

## 2021-08-15 NOTE — ED Provider Notes (Signed)
[facility]  ____________________________________________   Event Date/Time   First MD Initiated Contact with Patient 08/15/21 1737     (approximate)  I have reviewed the triage vital signs and the nursing notes.   HISTORY  Chief Complaint No chief complaint on file.    HPI Juan Padilla is a 40 y.o. male with past medical history of seizure disorder, acid reflux who presents with chills and body aches and fever.  Symptoms started last night with diffuse body aches.  He then started to have cough and had a fever of 101 today.  He endorses a mild headache that is worse with coughing but improved with ibuprofen.  Does not have a headache currently.  He denies nausea vomiting or diarrhea.  Still tolerating p.o.  Denies shortness of breath or chest pain.  A coworker recently tested positive for influenza.         Past Medical History:  Diagnosis Date   Fatty liver    GERD (gastroesophageal reflux disease)    Seizures ([facility])     There are no problems to display for this patient.   Past Surgical History:  Procedure Laterality Date   NO PAST SURGERIES     SHOULDER ARTHROSCOPY WITH LABRAL REPAIR Right 05/23/2018   Procedure: SHOULDER ARTHROSCOPY WITH LABRAL REPAIR,SUBACROMINAL DECOMPRESSION;  Surgeon: [name], MD;  Location: [facility] ORS;  Service: Orthopedics;  Laterality: Right;    Prior to Admission medications   Medication Sig Start Date End Date Taking? Authorizing Provider  acetaminophen (TYLENOL) 500 MG tablet Take 1,000 mg by mouth every 6 (six) hours as needed (for pain.).    [provider]  cephALEXin (KEFLEX) 500 MG capsule Take 1 capsule (500 mg total) by mouth 3 (three) times daily. 03/21/21   Irean Hong, MD  ondansetron (ZOFRAN) 4 MG tablet Take 1 tablet (4 mg total) by mouth every 8 (eight) hours as needed for nausea or vomiting. 05/23/18   [name], MD  oxyCODONE (OXY IR/ROXICODONE) 5 MG immediate release tablet  Take 1 tablet (5 mg total) by mouth every 4 (four) hours as needed. 05/23/18   [name], MD  ranitidine (ZANTAC) 150 MG tablet Take 150 mg by mouth as needed for heartburn.    [provider]    Allergies Patient has no known allergies.  No family history on file.  Social History Social History   Tobacco Use   Smoking status: Former    Packs/day: 1.50    Years: 10.00    Pack years: 15.00    Types: Cigarettes    Quit date: 05/16/2014    Years since quitting: 7.2   Smokeless tobacco: Never  Vaping Use   Vaping Use: Never used  Substance Use Topics   Alcohol use: Yes    Comment: OCC   Drug use: No    Review of Systems   Review of Systems  Constitutional:  Positive for chills and fever. Negative for appetite change.  HENT:  Positive for congestion.   Respiratory:  Positive for cough. Negative for shortness of breath.   Gastrointestinal:  Negative for abdominal pain, diarrhea, nausea and vomiting.  Musculoskeletal:  Positive for myalgias.  Neurological:  Positive for headaches.  All other systems reviewed and are negative.  Physical Exam Updated Vital Signs BP (!) 145/97    Pulse 94    Temp 98.6 F (37 C) (Oral)    Resp 16    Ht 5\' 7"  (1.702 m)  Wt 127.5 kg    SpO2 96%    BMI 44.01 kg/m   Physical Exam Vitals and nursing note reviewed.  Constitutional:      General: He is not in acute distress.    Appearance: Normal appearance.  HENT:     Head: Normocephalic and atraumatic.  Eyes:     General: No scleral icterus.    Conjunctiva/sclera: Conjunctivae normal.  Cardiovascular:     Rate and Rhythm: Normal rate and regular rhythm.  Pulmonary:     Effort: Pulmonary effort is normal. No respiratory distress.     Breath sounds: Normal breath sounds. No wheezing.  Musculoskeletal:        General: No deformity or signs of injury.     Cervical back: Normal range of motion.  Skin:    Coloration: Skin is not jaundiced or pale.  Neurological:      General: No focal deficit present.     Mental Status: He is alert and oriented to person, place, and time. Mental status is at baseline.  Psychiatric:        Mood and Affect: Mood normal.        Behavior: Behavior normal.     LABS (all labs ordered are listed, but only abnormal results are displayed)  Labs Reviewed  RESP PANEL BY RT-PCR (FLU A&B, COVID) [id number]   ____________________________________________  EKG   ____________________________________________  RADIOLOGY I, Randol Kern, personally viewed and evaluated these images (plain radiographs) as part of my medical decision making, as well as reviewing the written report by the radiologist.  ED MD interpretation:      ____________________________________________   PROCEDURES  Procedure(s) performed (including Critical Care):  Procedures   ____________________________________________   INITIAL IMPRESSION / ASSESSMENT AND PLAN / ED COURSE     40 year old male presenting with myalgias fever cough.  Symptoms have been going on since yesterday.  Fever of 101 at home.  He is afebrile here with normal vital signs.  Appears very well with clear lungs.  He is tolerating p.o. appears well-hydrated.  No shortness of breath.  Suspect influenza-like illness.  Testing for flu and COVID sent.  Advised ongoing supportive care and we discussed return precautions.      ____________________________________________   FINAL CLINICAL IMPRESSION(S) / ED DIAGNOSES  Final diagnoses:  Viral illness     ED Discharge Orders     None        Note:  This document was prepared using Dragon voice recognition software and may include unintentional dictation errors.    Georga Hacking, MD 08/15/21 [phone]

## 2021-08-15 NOTE — ED Triage Notes (Signed)
Pt to ED for fever, cough, sore throat that started today. Coworker has flu.  NAD noted
